# Patient Record
Sex: Female | Born: 1945 | Race: Black or African American | Hispanic: No | Smoking: Never smoker
Health system: Southern US, Community
[De-identification: ages and names within clinical notes are randomized; demographics above are authoritative.]

## PROBLEM LIST (undated history)

## (undated) DIAGNOSIS — M1990 Unspecified osteoarthritis, unspecified site: Secondary | ICD-10-CM

## (undated) DIAGNOSIS — E559 Vitamin D deficiency, unspecified: Secondary | ICD-10-CM

## (undated) DIAGNOSIS — A64 Unspecified sexually transmitted disease: Secondary | ICD-10-CM

## (undated) DIAGNOSIS — T7840XA Allergy, unspecified, initial encounter: Secondary | ICD-10-CM

## (undated) HISTORY — DX: Vitamin D deficiency, unspecified: E55.9

## (undated) HISTORY — DX: Allergy, unspecified, initial encounter: T78.40XA

## (undated) HISTORY — PX: TONSILLECTOMY: SUR1361

## (undated) HISTORY — DX: Unspecified sexually transmitted disease: A64

---

## 1988-11-28 HISTORY — PX: CHOLECYSTECTOMY: SHX55

## 1989-03-30 HISTORY — PX: ABDOMINAL HYSTERECTOMY: SHX81

## 1997-07-17 ENCOUNTER — Emergency Department (HOSPITAL_COMMUNITY): Admission: EM | Admit: 1997-07-17 | Discharge: 1997-07-17 | Payer: Self-pay | Admitting: Emergency Medicine

## 1997-08-20 ENCOUNTER — Other Ambulatory Visit: Admission: RE | Admit: 1997-08-20 | Discharge: 1997-08-20 | Payer: Self-pay | Admitting: Obstetrics and Gynecology

## 2000-09-21 ENCOUNTER — Observation Stay (HOSPITAL_COMMUNITY): Admission: RE | Admit: 2000-09-21 | Discharge: 2000-09-22 | Payer: Self-pay

## 2000-09-21 ENCOUNTER — Encounter (INDEPENDENT_AMBULATORY_CARE_PROVIDER_SITE_OTHER): Payer: Self-pay | Admitting: Specialist

## 2001-03-10 ENCOUNTER — Encounter: Admission: RE | Admit: 2001-03-10 | Discharge: 2001-03-10 | Payer: Self-pay | Admitting: Family Medicine

## 2001-03-10 ENCOUNTER — Encounter: Payer: Self-pay | Admitting: Family Medicine

## 2001-11-29 ENCOUNTER — Encounter: Payer: Self-pay | Admitting: Internal Medicine

## 2001-11-29 ENCOUNTER — Encounter: Admission: RE | Admit: 2001-11-29 | Discharge: 2001-11-29 | Payer: Self-pay | Admitting: Internal Medicine

## 2002-04-29 ENCOUNTER — Ambulatory Visit (HOSPITAL_COMMUNITY): Admission: RE | Admit: 2002-04-29 | Discharge: 2002-04-30 | Payer: Self-pay | Admitting: Internal Medicine

## 2003-01-22 ENCOUNTER — Encounter: Payer: Self-pay | Admitting: *Deleted

## 2003-01-22 ENCOUNTER — Ambulatory Visit (HOSPITAL_COMMUNITY): Admission: RE | Admit: 2003-01-22 | Discharge: 2003-01-22 | Payer: Self-pay | Admitting: *Deleted

## 2004-02-19 ENCOUNTER — Encounter: Admission: RE | Admit: 2004-02-19 | Discharge: 2004-02-19 | Payer: Self-pay | Admitting: Internal Medicine

## 2008-11-14 ENCOUNTER — Encounter: Admission: RE | Admit: 2008-11-14 | Discharge: 2008-11-14 | Payer: Self-pay | Admitting: Obstetrics and Gynecology

## 2009-04-24 ENCOUNTER — Ambulatory Visit (HOSPITAL_COMMUNITY): Admission: RE | Admit: 2009-04-24 | Discharge: 2009-04-24 | Payer: Self-pay | Admitting: Obstetrics and Gynecology

## 2009-04-24 ENCOUNTER — Encounter: Payer: Self-pay | Admitting: Obstetrics and Gynecology

## 2009-04-24 ENCOUNTER — Ambulatory Visit: Payer: Self-pay | Admitting: Surgery

## 2009-11-18 ENCOUNTER — Encounter: Admission: RE | Admit: 2009-11-18 | Discharge: 2009-11-18 | Payer: Self-pay | Admitting: Obstetrics and Gynecology

## 2010-08-15 NOTE — Op Note (Signed)
[facility]  Patient:    Brandy Brown, Brandy Brown                       MRN: [id number] Proc. Date: 09/21/00 Adm. Date:  [id number] Attending:  Meredith Leeds                           Operative Report  PREOPERATIVE DIAGNOSIS:   Symptomatic gallstones.  POSTOPERATIVE DIAGNOSIS:  Symptomatic gallstones.  OPERATION:  Laparoscopic cholecystectomy.  SURGEON:  Zigmund Daniel, M.D.  ASSISTANT:  Anselm Pancoast. Zachery Dakins, M.D.  ANESTHESIA:  General.  DESCRIPTION OF PROCEDURE:  After the patient had adequate monitoring and general anesthesia and routine preparation and draping of the abdomen, I made a short transverse infraumbilical incision at the site of a previous laparoscopy.  I enlarged it slightly to each side.  I dissected down to the fascia and opened it longitudinally, then carefully opened the peritoneum bluntly.  Entry into the peritoneum was easy, and I saw no evidence of injury to the small bowel.  I placed an O Vicryl pursestring suture in the fascia, secured a Hasson cannula, inflated the abdomen with CO2 and examined the contents.  I saw no abnormalities except for slightly inflamed and edematous gallbladder.  I anesthetized three additional port sites and put in three additional ports and then retracted the gallbladder upward and to the right with the patient positioned head up, foot down and tilted to the left.  The hepatoduodenal ligament and infundibulum of the gallbladder were easily discernible.  I dissected out the cystic artery and the cystic duct clearly identifying each, and I clearly identified the emergence of the cystic duct from the infundibulum of the gallbladder.  I clipped the cystic artery with three clips, cut between the two closer to the gallbladder and then I placed four clips on the cystic duct and cut between the two closest to the gallbladder.  I then dissected the gallbladder from the liver using the spatula cautery.   It came off nicely and hemostasis was easily obtained.  I then removed the gallbladder from the body through the umbilical incision.  It was full of gallstones and I had to open it and remove some of the stones in order to get it out.  This resulted in spillage of a couple of gallstones into the abdominal cavity, and I retrieved those.  I copiously irrigated that area and the right upper quadrant and then removed the irrigant.  Bleeding was not a problem.  The clips on the duct and artery were secure.  I tied the pursestring suture and then removed the lateral ports under direct vision and removed the CO2 and then removed the epigastric port.  I closed all skin incisions with intracuticular 4-0 Vicryl and Steri-Strips.  She tolerated the operation well. DD:  09/21/00 TD:  09/21/00 Job: [id number]

## 2010-08-15 NOTE — Discharge Summary (Signed)
NAME:  Brandy Brown, Brandy Brown                          ACCOUNT NO.:  [id number]   MEDICAL RECORD NO.:  [id number]                   PATIENT TYPE:  OIB   LOCATION:  0445                                 FACILITY:  [facility]   PHYSICIAN:  Brandy Brown, M.D.                DATE OF BIRTH:  1945/04/12   DATE OF ADMISSION:  04/29/2002  DATE OF DISCHARGE:  04/30/2002                                 DISCHARGE SUMMARY   PRIMARY CARE PHYSICIAN:  Sharlet Salina, M.D.   DISCHARGE DIAGNOSES:  1. Nausea and vomiting with dehydration, resolved.  2. Elevated liver function tests improving.  3. Hyperglycemia secondary to intravenous fluids.   DISCHARGE MEDICATIONS:  Estrace 1 mg daily.   PROCEDURES AND STUDIES:  None.   ADMISSION LABORATORY DATA:  WBC 7.4, RBC 3.85, hemoglobin 11.1, hematocrit  31.5.  Sodium 129, potassium 3.3, chloride 96, CO2 of 28, glucose 188, BUN  13, creatinine 0.8, total bilirubin 1.5, direct bilirubin 0.3, indirect  bilirubin 1.2, alkaline phosphatase 132, AST 408, ALT 199, total protein 7,  albumin 3.1.   DISPOSITION:  The patient will be discharged home.   CONDITION ON DISCHARGE:  Stable.   HISTORY OF PRESENT ILLNESS:  This is a 65 year old female who presented to  Dr. Jacqualine Code office on January 31st with complaints of nausea and vomiting,  weakness, and dizziness for the past few days.  At the walk in clinic, she  was hydrated with 3 liters of D5W.  A CMET was done which revealed a blood  sugar of 560 and an AST of 257 and an ALT of 125.  The patient was sent to  [facility] for admission; she was a direct admit.   HOSPITAL COURSE:  1. Nausea and vomiting:  The patient had reported having a viral syndrome     over the past weeks with some nausea and vomiting.  During her short     hospitalization here, the patient had no complaints of nausea and     vomiting.  She was tolerating her diet without any problems.  She was     hydrated with IV fluids overnight.  Orthostatic  vital signs were obtained     which were negative.  2. Elevated liver function tests:  Questionable etiology, may be secondary     to her dehydrated status.  Prior to discharge her LFTs were coming down.     Her discharge AST is 263 and ALT is 185 and alkaline phosphatase is     normal at 114.  The patient had no complaints of abdominal pain or     tenderness with palpation, no organomegaly was seen.  Recommendations are     to follow up with her primary care physician this week with repeat labs.  3. Hyperglycemia secondary to intravenous fluids:  Again, the patient did     receive 3 liters of D5W on an  outpatient basis.  She was covered with     sliding scale insulin.  Her CBGs were normal, reading at 70, 87, and 70.     Hemoglobin A1C is 5.6.   DISCHARGE LABORATORY DATA:  WBC count 5.9, RBC 3.62, hemoglobin 10,  hematocrit 29.7.  Sodium 141, potassium 3.9, chloride 109, CO2 of 28,  glucose 82, BUN 9, creatinine 0.8, total bilirubin 1.3, direct bilirubin  0.2, indirect bilirubin is 1.1, alkaline phosphatase 114, AST 263, ALT 185,  albumin 2.7, calcium 7.9.   DISCHARGE INSTRUCTIONS:  The patient should be seen by her primary care  physician this week.     Stephanie Swaziland, NP                      Brandy Brown, M.D.    SJ/MEDQ  D:  04/30/2002  T:  04/30/2002  Job:  [id number]   cc:   Sharlet Salina, M.D.  [address]  [place] [address]  Fax: [phone]

## 2011-01-05 ENCOUNTER — Other Ambulatory Visit: Payer: Self-pay | Admitting: Obstetrics and Gynecology

## 2011-01-05 DIAGNOSIS — Z1231 Encounter for screening mammogram for malignant neoplasm of breast: Secondary | ICD-10-CM

## 2011-01-06 ENCOUNTER — Ambulatory Visit
Admission: RE | Admit: 2011-01-06 | Discharge: 2011-01-06 | Disposition: A | Discharge status: Home / Self Care | Payer: Federal, State, Local not specified - PPO | Source: Ambulatory Visit | Attending: Obstetrics and Gynecology | Admitting: Obstetrics and Gynecology

## 2011-01-06 DIAGNOSIS — Z1231 Encounter for screening mammogram for malignant neoplasm of breast: Secondary | ICD-10-CM

## 2012-01-03 ENCOUNTER — Emergency Department (HOSPITAL_BASED_OUTPATIENT_CLINIC_OR_DEPARTMENT_OTHER)
Admission: EM | Admit: 2012-01-03 | Discharge: 2012-01-03 | Disposition: A | Discharge status: Home / Self Care | Payer: Federal, State, Local not specified - PPO | Attending: Emergency Medicine | Admitting: Emergency Medicine

## 2012-01-03 ENCOUNTER — Encounter (HOSPITAL_BASED_OUTPATIENT_CLINIC_OR_DEPARTMENT_OTHER): Payer: Self-pay | Admitting: Emergency Medicine

## 2012-01-03 DIAGNOSIS — Z882 Allergy status to sulfonamides status: Secondary | ICD-10-CM | POA: Insufficient documentation

## 2012-01-03 DIAGNOSIS — H109 Unspecified conjunctivitis: Secondary | ICD-10-CM | POA: Insufficient documentation

## 2012-01-03 MED ORDER — FLUORESCEIN SODIUM 1 MG OP STRP
1.0000 | ORAL_STRIP | Freq: Once | OPHTHALMIC | Status: AC
  Administered 2012-01-03: 1 via OPHTHALMIC
  Filled 2012-01-03: qty 1

## 2012-01-03 MED ORDER — ERYTHROMYCIN 5 MG/GM OP OINT
TOPICAL_OINTMENT | OPHTHALMIC | Status: AC
  Filled 2012-01-03: qty 3.5

## 2012-01-03 MED ORDER — ERYTHROMYCIN 5 MG/GM OP OINT
TOPICAL_OINTMENT | Freq: Four times a day (QID) | OPHTHALMIC | Status: DC
  Administered 2012-01-03: 04:00:00 via OPHTHALMIC

## 2012-01-03 MED ORDER — TETRACAINE HCL 0.5 % OP SOLN
2.0000 [drp] | Freq: Once | OPHTHALMIC | Status: AC
  Administered 2012-01-03: 2 [drp] via OPHTHALMIC
  Filled 2012-01-03: qty 2

## 2012-01-03 MED ORDER — HYDROCODONE-ACETAMINOPHEN 5-325 MG PO TABS: 1.0000 | ORAL_TABLET | Freq: Four times a day (QID) | ORAL | Status: DC | PRN

## 2012-01-03 NOTE — ED Provider Notes (Signed)
History     CSN: [id number]  Arrival date & time 01/03/12  1478   First MD Initiated Contact with Patient 01/03/12 0357      Chief Complaint  Patient presents with  . Foreign Body in Eye    (Consider location/radiation/quality/duration/timing/severity/associated sxs/prior treatment) HPI This is a 66 year old female who's had a foreign object sensation in her right eye yesterday evening about 7:30. She has not aware of any specific trauma. She has been irrigating her eye without relief. Pain and irritation have worsened since. The symptoms are now moderate to severe. There is associated redness and mucoid exudate. Her vision is only slightly blurry in that eye.  History reviewed. No pertinent past medical history.  Past Surgical History  Procedure Date  . Abdominal hysterectomy     No family history on file.  History  Substance Use Topics  . Smoking status: Never Smoker   . Smokeless tobacco: Not on file  . Alcohol Use: No    OB History    Grav Para Term Preterm Abortions TAB SAB Ect Mult Living                  Review of Systems  All other systems reviewed and are negative.    Allergies  Sulfa drugs cross reactors  Home Medications  No current outpatient prescriptions on file.  BP 171/69  Pulse 84  Temp 97.7 F (36.5 C) (Oral)  Resp 20  Ht 5\' 1"  (1.549 m)  Wt 140 lb (63.504 kg)  BMI 26.45 kg/m2  SpO2 99%  Physical Exam General: Well-developed, well-nourished female in no acute distress; appearance consistent with age of record HENT: normocephalic, atraumatic Eyes: pupils equal round and reactive to light; extraocular muscles intact; right conjunctival inflammation with mucoid exudate, no foreign body seen, no fluorescein uptake on the cornea Neck: supple Lungs: Normal respiratory effort and excursion Abdomen: soft; nondistended Extremities: No deformity; full range of motion Neurologic: Awake, alert and oriented; motor function intact in all  extremities and symmetric; no facial droop Skin: Warm and dry Psychiatric: Normal mood and affect    ED Course  Procedures (including critical care time)    MDM  No corneal abrasions seen. The symptoms are more consistent with an acute conjunctivitis. We'll treat for conjunctivitis and have patient follow up with her optometrist, Dr. Rubye Oaks.         Hanley Seamen, MD 01/03/12 [phone]

## 2012-01-03 NOTE — ED Notes (Signed)
Pt states she feels like she has a foreign object in right eye. Pt has attempted to flush eye without relief.

## 2012-01-18 ENCOUNTER — Other Ambulatory Visit: Payer: Self-pay | Admitting: Obstetrics and Gynecology

## 2012-01-18 DIAGNOSIS — Z1231 Encounter for screening mammogram for malignant neoplasm of breast: Secondary | ICD-10-CM

## 2012-02-18 ENCOUNTER — Ambulatory Visit
Admission: RE | Admit: 2012-02-18 | Discharge: 2012-02-18 | Disposition: A | Discharge status: Home / Self Care | Payer: Federal, State, Local not specified - PPO | Source: Ambulatory Visit | Attending: Obstetrics and Gynecology | Admitting: Obstetrics and Gynecology

## 2012-02-18 DIAGNOSIS — Z1231 Encounter for screening mammogram for malignant neoplasm of breast: Secondary | ICD-10-CM

## 2012-09-08 ENCOUNTER — Ambulatory Visit (INDEPENDENT_AMBULATORY_CARE_PROVIDER_SITE_OTHER): Payer: Medicare Other | Admitting: Emergency Medicine

## 2012-09-08 VITALS — BP 160/83 | HR 82 | Temp 98.3°F | Resp 16 | Ht 62.0 in | Wt 132.2 lb

## 2012-09-08 DIAGNOSIS — M79601 Pain in right arm: Secondary | ICD-10-CM

## 2012-09-08 DIAGNOSIS — S51809A Unspecified open wound of unspecified forearm, initial encounter: Secondary | ICD-10-CM

## 2012-09-08 DIAGNOSIS — M79609 Pain in unspecified limb: Secondary | ICD-10-CM

## 2012-09-08 DIAGNOSIS — S51811A Laceration without foreign body of right forearm, initial encounter: Secondary | ICD-10-CM

## 2012-09-08 NOTE — Progress Notes (Signed)
Procedure:  Verbal consent obtained - local anesthesia with 2% lido.  Closed with 5-0 Ethilon #1 horizontal sutures.  Wound care d/w patient.

## 2012-09-08 NOTE — Progress Notes (Signed)
[facility] [address] [place] [address] [phone]  Date:  09/08/2012   Name:  Brandy Brown   DOB:  19-Jan-1946   MRN:  [id number]  PCP:  No primary provider on file.    Chief Complaint: No chief complaint on file.   History of Present Illness:  Brandy Brown is a 67 y.o. very pleasant female patient who presents with the following:  Working in the kitchen and stabbed herself in the right forearm with a kitchen knife.  She is current on TD.  No improvement with over the counter medications or other home remedies. Denies other complaint or health concern today.   There are no active problems to display for this patient.   Past Medical History  Diagnosis Date  . Allergy     Past Surgical History  Procedure Laterality Date  . Abdominal hysterectomy    . Cholecystectomy      History  Substance Use Topics  . Smoking status: Never Smoker   . Smokeless tobacco: Not on file  . Alcohol Use: No    History reviewed. No pertinent family history.  Allergies  Allergen Reactions  . Sulfa Drugs Cross Reactors     Medication list has been reviewed and updated.  Current Outpatient Prescriptions on File Prior to Visit  Medication Sig Dispense Refill  . HYDROcodone-acetaminophen (NORCO/VICODIN) 5-325 MG per tablet Take 1-2 tablets by mouth every 6 (six) hours as needed for pain.  20 tablet  0   No current facility-administered medications on file prior to visit.    Review of Systems:  As per HPI, otherwise negative.    Physical Examination: Filed Vitals:   09/08/12 1437  BP: 160/83  Pulse: 82  Temp: 98.3 F (36.8 C)  Resp: 16   Filed Vitals:   09/08/12 1437  Height: 5\' 2"  (1.575 m)  Weight: 132 lb 3.2 oz (59.966 kg)   Body mass index is 24.17 kg/(m^2). Ideal Body Weight: Weight in (lb) to have BMI = 25: 136.4   GEN: WDWN, NAD, Non-toxic, Alert & Oriented x 3 HEENT: Atraumatic, Normocephalic.  Ears and Nose: No external  deformity. EXTR: No clubbing/cyanosis/edema NEURO: Normal gait.  PSYCH: Normally interactive. Conversant. Not depressed or anxious appearing.  Calm demeanor.  Right forearm:  1 cm laceration linear in nature on radial aspect of flexor forearm.   No FB, NATI  Assessment and Plan: Laceration forearm   Signed,  Phillips Odor, MD

## 2012-09-08 NOTE — Patient Instructions (Addendum)

## 2012-09-14 ENCOUNTER — Ambulatory Visit (INDEPENDENT_AMBULATORY_CARE_PROVIDER_SITE_OTHER): Payer: Medicare Other | Admitting: Family Medicine

## 2012-09-14 VITALS — BP 134/82 | HR 72 | Temp 98.1°F | Resp 17 | Wt 134.0 lb

## 2012-09-14 DIAGNOSIS — Z5189 Encounter for other specified aftercare: Secondary | ICD-10-CM

## 2012-09-14 DIAGNOSIS — S51801D Unspecified open wound of right forearm, subsequent encounter: Secondary | ICD-10-CM

## 2012-09-14 NOTE — Progress Notes (Signed)
Subjective: No problems from the wound which was repaired one week ago  Objective: Wound appears well-healed. Single suture was removed. Patient tolerated well.  Assessment: Wound right forearm, suture removal  Plan: Return when necessary.

## 2012-09-14 NOTE — Patient Instructions (Signed)
Protect with a Band-Aid for a few more days. Return if problems.

## 2013-04-06 ENCOUNTER — Other Ambulatory Visit: Payer: Self-pay

## 2013-04-06 DIAGNOSIS — Z1231 Encounter for screening mammogram for malignant neoplasm of breast: Secondary | ICD-10-CM

## 2013-04-17 ENCOUNTER — Ambulatory Visit (INDEPENDENT_AMBULATORY_CARE_PROVIDER_SITE_OTHER): Payer: Federal, State, Local not specified - PPO | Admitting: Nurse Practitioner

## 2013-04-17 ENCOUNTER — Encounter: Payer: Self-pay | Admitting: Nurse Practitioner

## 2013-04-17 VITALS — BP 126/64 | HR 72 | Ht 61.75 in | Wt 134.0 lb

## 2013-04-17 DIAGNOSIS — E559 Vitamin D deficiency, unspecified: Secondary | ICD-10-CM

## 2013-04-17 DIAGNOSIS — Z01419 Encounter for gynecological examination (general) (routine) without abnormal findings: Secondary | ICD-10-CM

## 2013-04-17 DIAGNOSIS — Z780 Asymptomatic menopausal state: Secondary | ICD-10-CM

## 2013-04-17 DIAGNOSIS — Z0000 Encounter for general adult medical examination without abnormal findings: Secondary | ICD-10-CM

## 2013-04-17 LAB — POCT URINALYSIS DIPSTICK
Bilirubin, UA: NEGATIVE
Blood, UA: NEGATIVE
Glucose, UA: NEGATIVE
Ketones, UA: NEGATIVE
Leukocytes, UA: NEGATIVE
Nitrite, UA: NEGATIVE
PROTEIN UA: NEGATIVE
UROBILINOGEN UA: NEGATIVE
pH, UA: 5

## 2013-04-17 LAB — HEMOGLOBIN, FINGERSTICK: HEMOGLOBIN, FINGERSTICK: 12.2 g/dL (ref 12.0–16.0)

## 2013-04-17 MED ORDER — VITAMIN D (ERGOCALCIFEROL) 1.25 MG (50000 UNIT) PO CAPS: 50000.0000 [IU] | ORAL_CAPSULE | ORAL | Status: DC

## 2013-04-17 MED ORDER — VALACYCLOVIR HCL 1 G PO TABS: 1000.0000 mg | ORAL_TABLET | Freq: Every day | ORAL | Status: DC

## 2013-04-17 NOTE — Progress Notes (Signed)
Patient ID: Brandy Brown, female   DOB: 05-28-1945, 68 y.o.   MRN: [id number] 68 y.o. G2P2002 Divorced African American Fe here for annual exam. Not SA since 2010.   No LMP recorded. Patient is postmenopausal.          Sexually active: no  The current method of family planning is status post hysterectomy.    Exercising: no  The patient does not participate in regular exercise at present. Smoker:  no  Health Maintenance: Pap:  hysterectomy MMG:  02/22/12, Bi-Rads 1: negative scheduled for 02/14/14 Colonoscopy:  2006, recheck 10 years BMD:   never TDaP:  03/06/10 Labs: HB: 12.2 Urine:  Negative, pH 5.0   reports that she has never smoked. She has never used smokeless tobacco. She reports that she does not drink alcohol or use illicit drugs.  Past Medical History  Diagnosis Date  . Allergy   . GERD (gastroesophageal reflux disease)   . Vitamin D deficiency   . STD (sexually transmitted disease)     HSV    Past Surgical History  Procedure Laterality Date  . Cholecystectomy  1990's  . Abdominal hysterectomy  1991    Fibroids, AUB    Current Outpatient Prescriptions  Medication Sig Dispense Refill  . HYDROcodone-acetaminophen (NORCO/VICODIN) 5-325 MG per tablet Take 1-2 tablets by mouth every 6 (six) hours as needed for pain.  20 tablet  0  . Vitamin D, Ergocalciferol, (DRISDOL) 50000 UNITS CAPS capsule Take 1 capsule by mouth once a week.       No current facility-administered medications for this visit.    Family History  Problem Relation Age of Onset  . Diabetes Mother   . Heart attack Father   . Diabetes Brother     ROS:  Pertinent items are noted in HPI.  Otherwise, a comprehensive ROS was negative.  Exam:   BP 126/64  Pulse 72  Ht 5' 1.75" (1.568 m)  Wt 134 lb (60.782 kg)  BMI 24.72 kg/m2 Height: 5' 1.75" (156.8 cm)  Ht Readings from Last 3 Encounters:  04/17/13 5' 1.75" (1.568 m)  09/08/12 5\' 2"  (1.575 m)  01/03/12 5\' 1"  (1.549 m)    General appearance:  alert, cooperative and appears stated age Head: Normocephalic, without obvious abnormality, atraumatic Neck: no adenopathy, supple, symmetrical, trachea midline and thyroid normal to inspection and palpation Lungs: clear to auscultation bilaterally Breasts: normal appearance, no masses or tenderness Heart: regular rate and rhythm Abdomen: soft, non-tender; no masses,  no organomegaly Extremities: extremities normal, atraumatic, no cyanosis or edema Skin: Skin color, texture, turgor normal. No rashes or lesions Lymph nodes: Cervical, supraclavicular, and axillary nodes normal. No abnormal inguinal nodes palpated Neurologic: Grossly normal   Pelvic: External genitalia:  no lesions              Urethra:  normal appearing urethra with no masses, tenderness or lesions              Bartholin's and Skene's: normal                 Vagina: normal appearing vagina with normal color and discharge, no lesions              Cervix: absent              Pap taken: no Bimanual Exam:  Uterus:  uterus absent              Adnexa: no mass, fullness, tenderness  Rectovaginal: Confirms               Anus:  normal sphincter tone, no lesions  A:  Well Woman with normal exam  S/P TAH secondary to fibroids and AUB 1991  History of HSV I/II + IgG  P:   Pap smear as per guidelines   Mammogram due now and is scheduled  Will get BMD baseline  Refill of Valtrex for a year  Counseled on breast self exam, mammography screening, adequate intake of calcium and vitamin D, diet and exercise, Kegel's exercises return annually or prn  An After Visit Summary was printed and given to the patient.

## 2013-04-17 NOTE — Patient Instructions (Addendum)

## 2013-04-18 LAB — VITAMIN D 25 HYDROXY (VIT D DEFICIENCY, FRACTURES): Vit D, 25-Hydroxy: 44 ng/mL (ref 30–89)

## 2013-04-18 LAB — COMPREHENSIVE METABOLIC PANEL
ALT: 23 U/L (ref 0–35)
AST: 20 U/L (ref 0–37)
Albumin: 4.1 g/dL (ref 3.5–5.2)
Alkaline Phosphatase: 74 U/L (ref 39–117)
BILIRUBIN TOTAL: 0.8 mg/dL (ref 0.3–1.2)
BUN: 12 mg/dL (ref 6–23)
CALCIUM: 9.5 mg/dL (ref 8.4–10.5)
CHLORIDE: 103 meq/L (ref 96–112)
CO2: 26 mEq/L (ref 19–32)
CREATININE: 0.71 mg/dL (ref 0.50–1.10)
Glucose, Bld: 79 mg/dL (ref 70–99)
Potassium: 3.9 mEq/L (ref 3.5–5.3)
Sodium: 139 mEq/L (ref 135–145)
Total Protein: 7.2 g/dL (ref 6.0–8.3)

## 2013-04-18 LAB — HEMOGLOBIN A1C
Hgb A1c MFr Bld: 5.5 % (ref <5.7)
Mean Plasma Glucose: 111 mg/dL (ref <117)

## 2013-04-18 LAB — TSH: TSH: 1.384 u[IU]/mL (ref 0.350–4.500)

## 2013-04-18 LAB — LIPID PANEL
Cholesterol: 196 mg/dL (ref 0–200)
HDL: 98 mg/dL (ref >39)
LDL CALC: 89 mg/dL (ref 0–99)
Total CHOL/HDL Ratio: 2 Ratio
Triglycerides: 46 mg/dL (ref <150)
VLDL: 9 mg/dL (ref 0–40)

## 2013-04-19 ENCOUNTER — Other Ambulatory Visit: Payer: Self-pay | Admitting: Nurse Practitioner

## 2013-04-19 NOTE — Telephone Encounter (Signed)
eScribe request from [facility] for refill on VALTREX Last filled - 04/17/13,  #90 X 3 Last AEX - 04/17/13 RX denied.  Filled on 04/17/13.

## 2013-04-20 ENCOUNTER — Telehealth: Payer: Self-pay | Admitting: *Deleted

## 2013-04-20 NOTE — Telephone Encounter (Signed)
Message copied by Luisa DagoPHILLIPS, STEPHANIE C on Thu Apr 20, 2013  1:02 PM ------      Message from: Ria CommentGRUBB, PATRICIA R      Created: Tue Apr 18, 2013  8:31 AM       Let patient know results - Good News ------

## 2013-04-20 NOTE — Telephone Encounter (Signed)
I have attempted to contact this patient by phone with the following results: left message to return my call on answering machine (home).  

## 2013-04-20 NOTE — Progress Notes (Signed)
Encounter reviewed by Dr. Conley SimmondsBrook Silva. Mammogram scheduled for 04/26/13.

## 2013-04-20 NOTE — Telephone Encounter (Signed)
Pt notified in result note.  

## 2013-04-26 ENCOUNTER — Ambulatory Visit
Admission: RE | Admit: 2013-04-26 | Discharge: 2013-04-26 | Disposition: A | Discharge status: Home / Self Care | Payer: Federal, State, Local not specified - PPO | Source: Ambulatory Visit

## 2013-04-26 DIAGNOSIS — Z1231 Encounter for screening mammogram for malignant neoplasm of breast: Secondary | ICD-10-CM

## 2013-04-29 ENCOUNTER — Other Ambulatory Visit: Payer: Self-pay | Admitting: Nurse Practitioner

## 2014-01-29 ENCOUNTER — Encounter: Payer: Self-pay | Admitting: Nurse Practitioner

## 2014-03-26 ENCOUNTER — Other Ambulatory Visit: Payer: Self-pay

## 2014-03-26 DIAGNOSIS — Z1231 Encounter for screening mammogram for malignant neoplasm of breast: Secondary | ICD-10-CM

## 2014-04-19 ENCOUNTER — Encounter: Payer: Self-pay | Admitting: Nurse Practitioner

## 2014-04-19 ENCOUNTER — Ambulatory Visit (INDEPENDENT_AMBULATORY_CARE_PROVIDER_SITE_OTHER): Payer: Federal, State, Local not specified - PPO | Admitting: Nurse Practitioner

## 2014-04-19 VITALS — BP 126/80 | HR 84 | Ht 61.75 in | Wt 134.6 lb

## 2014-04-19 DIAGNOSIS — H6120 Impacted cerumen, unspecified ear: Secondary | ICD-10-CM

## 2014-04-19 DIAGNOSIS — Z0000 Encounter for general adult medical examination without abnormal findings: Secondary | ICD-10-CM

## 2014-04-19 DIAGNOSIS — E2839 Other primary ovarian failure: Secondary | ICD-10-CM

## 2014-04-19 DIAGNOSIS — H6123 Impacted cerumen, bilateral: Secondary | ICD-10-CM

## 2014-04-19 DIAGNOSIS — Z01419 Encounter for gynecological examination (general) (routine) without abnormal findings: Secondary | ICD-10-CM

## 2014-04-19 DIAGNOSIS — E559 Vitamin D deficiency, unspecified: Secondary | ICD-10-CM

## 2014-04-19 LAB — LIPID PANEL
Cholesterol: 208 mg/dL — ABNORMAL HIGH (ref 0–200)
HDL: 108 mg/dL (ref >39)
LDL CALC: 90 mg/dL (ref 0–99)
TRIGLYCERIDES: 51 mg/dL (ref <150)
Total CHOL/HDL Ratio: 1.9 Ratio
VLDL: 10 mg/dL (ref 0–40)

## 2014-04-19 LAB — POCT URINALYSIS DIPSTICK
Leukocytes, UA: NEGATIVE
UROBILINOGEN UA: NEGATIVE
pH, UA: 5

## 2014-04-19 LAB — COMPREHENSIVE METABOLIC PANEL
ALBUMIN: 3.8 g/dL (ref 3.5–5.2)
ALK PHOS: 90 U/L (ref 39–117)
ALT: 23 U/L (ref 0–35)
AST: 27 U/L (ref 0–37)
BUN: 14 mg/dL (ref 6–23)
CHLORIDE: 102 meq/L (ref 96–112)
CO2: 27 meq/L (ref 19–32)
CREATININE: 0.85 mg/dL (ref 0.50–1.10)
Calcium: 9.7 mg/dL (ref 8.4–10.5)
GLUCOSE: 75 mg/dL (ref 70–99)
Potassium: 4.7 mEq/L (ref 3.5–5.3)
SODIUM: 139 meq/L (ref 135–145)
TOTAL PROTEIN: 7.2 g/dL (ref 6.0–8.3)
Total Bilirubin: 0.8 mg/dL (ref 0.2–1.2)

## 2014-04-19 LAB — HEMOGLOBIN, FINGERSTICK: HEMOGLOBIN, FINGERSTICK: 13.3 g/dL (ref 12.0–16.0)

## 2014-04-19 LAB — TSH: TSH: 2.249 u[IU]/mL (ref 0.350–4.500)

## 2014-04-19 MED ORDER — VITAMIN D (ERGOCALCIFEROL) 1.25 MG (50000 UNIT) PO CAPS: 50000.0000 [IU] | ORAL_CAPSULE | ORAL | Status: DC

## 2014-04-19 MED ORDER — VALACYCLOVIR HCL 1 G PO TABS: 1000.0000 mg | ORAL_TABLET | Freq: Every day | ORAL | Status: DC

## 2014-04-19 NOTE — Progress Notes (Signed)
69 y.o. [id number] Divorced  African American Fe here for annual exam.  Complains of fullness of right ear.  No sinus congestion, pain, hearing changes.  Patient's last menstrual period was 03/30/1989.          Sexually active: No.  The current method of family planning is status post hysterectomy.    Exercising: No.  The patient does not participate in regular exercise at present. Smoker:  no  Health Maintenance: Pap: TAH, none on record MMG:  04/28/13 Bi-Rads 1: Negative scheduled next Friday Colonoscopy:  2006 @ [facility] - normal f/u in 10 years  BMD:  02/22/2004   TDaP:  03/26/10  Labs: Hgb: 12.4 ; Urine: Small ketones   reports that she has never smoked. She has never used smokeless tobacco. She reports that she does not drink alcohol or use illicit drugs.  Past Medical History  Diagnosis Date  . Allergy   . GERD (gastroesophageal reflux disease)   . Vitamin D deficiency   . STD (sexually transmitted disease)     HSV    Past Surgical History  Procedure Laterality Date  . Cholecystectomy  1990's  . Abdominal hysterectomy  1991    Fibroids, AUB  . Tonsillectomy  age 69    Current Outpatient Prescriptions  Medication Sig Dispense Refill  . valACYclovir (VALTREX) 1000 MG tablet Take 1 tablet (1,000 mg total) by mouth daily. 90 tablet 3  . Vitamin D, Ergocalciferol, (DRISDOL) 50000 UNITS CAPS capsule Take 1 capsule (50,000 Units total) by mouth once a week. 30 capsule 3   No current facility-administered medications for this visit.    Family History  Problem Relation Age of Onset  . Diabetes Mother   . Heart attack Father   . Diabetes Brother     ROS:  Pertinent items are noted in HPI.  Otherwise, a comprehensive ROS was negative.  Exam:   BP 126/80 mmHg  Pulse 84  Ht 5' 1.75" (1.568 m)  Wt 134 lb 9.6 oz (61.054 kg)  BMI 24.83 kg/m2  LMP 03/30/1989 Height: 5' 1.75" (156.8 cm) Ht Readings from Last 3 Encounters:  04/19/14 5' 1.75" (1.568 m)  04/17/13 5' 1.75"  (1.568 m)  09/08/12 5\' 2"  (1.575 m)    General appearance: alert, cooperative and appears stated age Head: Normocephalic, without obvious abnormality, atraumatic  Both TM's are not visualized secondary to cerumen. Neck: no adenopathy, supple, symmetrical, trachea midline and thyroid normal to inspection and palpation Lungs: clear to auscultation bilaterally Breasts: normal appearance, no masses or tenderness Heart: regular rate and rhythm Abdomen: soft, non-tender; no masses,  no organomegaly Extremities: extremities normal, atraumatic, no cyanosis or edema Skin: Skin color, texture, turgor normal. No rashes or lesions Lymph nodes: Cervical, supraclavicular, and axillary nodes normal. No abnormal inguinal nodes palpated Neurologic: Grossly normal   Pelvic: External genitalia:  no lesions              Urethra:  normal appearing urethra with no masses, tenderness or lesions              Bartholin's and Skene's: normal                 Vagina: normal appearing vagina with normal color and discharge, no lesions              Cervix: absent              Pap taken: No. Bimanual Exam:  Uterus:  uterus absent  Adnexa: no mass, fullness, tenderness               Rectovaginal: Confirms               Anus:  normal sphincter tone, no lesions  Chaperone present: No  A:  Well Woman with normal exam  S/P TAH secondary to fibroids and AUB 1991 History of HSV I/II + IgG  History of Vit D deficiency  Cerumen both ears  P:   Reviewed health and wellness pertinent to exam  Pap smear not taken today  Mammogram is due 10/16  Follow with labs  OTC Cerumenex to use prn  Colonoscopy is due this summer and will schedule  Counseled on breast self exam, mammography screening, adequate intake of calcium and vitamin D, diet and exercise, Kegel's exercises return annually or prn  An After Visit Summary was printed and given to the patient.

## 2014-04-19 NOTE — Patient Instructions (Signed)

## 2014-04-20 LAB — VITAMIN D 25 HYDROXY (VIT D DEFICIENCY, FRACTURES): Vit D, 25-Hydroxy: 24 ng/mL — ABNORMAL LOW (ref 30–100)

## 2014-04-22 NOTE — Progress Notes (Signed)
Reviewed personally.  M. Suzanne Sanae Willetts, MD.  

## 2014-04-27 ENCOUNTER — Ambulatory Visit
Admission: RE | Admit: 2014-04-27 | Discharge: 2014-04-27 | Disposition: A | Discharge status: Home / Self Care | Payer: Federal, State, Local not specified - PPO | Source: Ambulatory Visit

## 2014-04-27 DIAGNOSIS — Z1231 Encounter for screening mammogram for malignant neoplasm of breast: Secondary | ICD-10-CM

## 2014-09-24 ENCOUNTER — Ambulatory Visit (INDEPENDENT_AMBULATORY_CARE_PROVIDER_SITE_OTHER): Payer: Federal, State, Local not specified - PPO | Admitting: Nurse Practitioner

## 2014-09-24 ENCOUNTER — Encounter: Payer: Self-pay | Admitting: Nurse Practitioner

## 2014-09-24 VITALS — BP 132/80 | HR 72 | Temp 98.2°F | Resp 20 | Wt 136.0 lb

## 2014-09-24 DIAGNOSIS — R300 Dysuria: Secondary | ICD-10-CM | POA: Diagnosis not present

## 2014-09-24 DIAGNOSIS — R1030 Lower abdominal pain, unspecified: Secondary | ICD-10-CM

## 2014-09-24 LAB — CBC WITH DIFFERENTIAL/PLATELET
BASOS ABS: 0 10*3/uL (ref 0.0–0.1)
BASOS PCT: 0 % (ref 0–1)
EOS ABS: 0 10*3/uL (ref 0.0–0.7)
EOS PCT: 0 % (ref 0–5)
HCT: 37.7 % (ref 36.0–46.0)
HEMOGLOBIN: 12.7 g/dL (ref 12.0–15.0)
LYMPHS ABS: 2.2 10*3/uL (ref 0.7–4.0)
Lymphocytes Relative: 26 % (ref 12–46)
MCH: 27.9 pg (ref 26.0–34.0)
MCHC: 33.7 g/dL (ref 30.0–36.0)
MCV: 82.9 fL (ref 78.0–100.0)
MONO ABS: 0.7 10*3/uL (ref 0.1–1.0)
MPV: 8.8 fL (ref 8.6–12.4)
Monocytes Relative: 8 % (ref 3–12)
Neutro Abs: 5.5 10*3/uL (ref 1.7–7.7)
Neutrophils Relative %: 66 % (ref 43–77)
PLATELETS: 299 10*3/uL (ref 150–400)
RBC: 4.55 MIL/uL (ref 3.87–5.11)
RDW: 13.4 % (ref 11.5–15.5)
WBC: 8.4 10*3/uL (ref 4.0–10.5)

## 2014-09-24 LAB — POCT URINALYSIS DIPSTICK
Bilirubin, UA: NEGATIVE
GLUCOSE UA: NEGATIVE
Nitrite, UA: NEGATIVE
Protein, UA: NEGATIVE
RBC UA: NEGATIVE
Urobilinogen, UA: NEGATIVE
pH, UA: 5

## 2014-09-24 NOTE — Patient Instructions (Signed)

## 2014-09-24 NOTE — Progress Notes (Signed)
S:  69 y.o.Divorced AA G2P2 female presents with complaint of UTI. Symptoms began 14 days ago. With symptoms of burning with urination, urinary frequency, urinary urgency along with lower pelvic discomfort, undocumented fever and chills with bowel changes.   Pertinent positives include:  the patient is having constitutional symptoms, including chills, fatigue and fevers.. Sexually active: no not since 2005.    She is menopausal but no symptoms of vaginal dryness. Last UTI documented very long time ago. Some recent bowel changes with dark red blood with stool - no current bleeding.  Recent diet change with eating cereal with nuts.  Increase in flatus.  Last colonoscopy 2006 at [facility] and was normal.  ROS: feels well, fatigued, fever and chills  O alert, oriented to person, place, and time, normal mood, behavior, speech, dress, motor activity, and thought processes   healthy,  alert,  not in acute distress, well developed and well nourished  Abdomen- soft and non tender, mild intermittent 'twinges' of pain throughout lower abdomen.  No rebounding, normal BS.  No CVA tenderness  pelvic is deferred   Diagnostic Test:    Urinalysis trace WBC and ketones   PROCEDURES:  Urine culture,urine micro, Stat CBC  Assessment: R/O UTI   Possible diverticulitis   Plan:  Maintain adequate hydration. Follow up if symptoms not improving, and as needed.   Medication Therapy: hold for now awaiting CBC   Lab: follow with urine culture and micro  Discussed care with Dr. Edward Jolly  RV

## 2014-09-25 ENCOUNTER — Telehealth: Payer: Self-pay | Admitting: Emergency Medicine

## 2014-09-25 LAB — URINALYSIS, MICROSCOPIC ONLY
Bacteria, UA: NONE SEEN
Casts: NONE SEEN
Crystals: NONE SEEN

## 2014-09-25 LAB — URINE CULTURE
COLONY COUNT: NO GROWTH
ORGANISM ID, BACTERIA: NO GROWTH

## 2014-09-25 NOTE — Telephone Encounter (Signed)
-----   Message from Patricia Grubb, FNP sent at 09/25/2014  8:25 AM EDT ----- Pt. was notified of stat CBC last pm at 7:30 pm.  She agrees to wait on treatment for UTI once urine culture is back.  She is to alert us if symptoms worsens.  She has agreed to let us refer her back to [facility] - as we feel these symptoms are GI related.  She is due for repeat colonoscopy anyway.  Will place the order and will you call to get that referral arranged.  Should be seen fairly soon with a two week duration of abdominal pain with fever and chills.  When you call with urine micro results just inform her of status on referral. 

## 2014-09-25 NOTE — Progress Notes (Signed)
Encounter reviewed by Dr. Janean SarkBrook Amundson C. Silva. Will need to return to Gastroenterology for consultation.

## 2014-09-25 NOTE — Telephone Encounter (Signed)
Called [facility] and patient is scheduled for first available appointment with [facility] for 10/05/14 at 1115 with PA Celso Amyina Garrett. This is first available appointment. The prior Gastroenterologist that patient has seen has since retired. Patient is also on wait list as well.  Patient agreeable to appointment. States she still doesn't feel well and that "something is out of whack" but denies worsening of symptoms. No fevers, however, has sweating and fatigue.   Advised patient still waiting on urine culture results.  Patient advised to return call to office or seek care in local Emergency Department if develops fevers, nausea, vomiting, abdominal pain or difficulty eating or drinking.  Routing to provider for final review. Patient agreeable to disposition. Will close encounter.   Patient aware provider will review message and nurse will return call if any additional advice or change of disposition.    cc Dr. Kathreen CosierSilva       Becky Frahm

## 2014-09-27 ENCOUNTER — Telehealth: Payer: Self-pay | Admitting: Emergency Medicine

## 2014-09-27 NOTE — Telephone Encounter (Signed)
Detailed message left to advise patient that urine culture was negative. Okay per designated party release form.   Referral has been completed, patient aware of appointment spoke with patient on 09/25/14 and referral information given.  Routing to provider for final review. Patient agreeable to disposition. Will close encounter.

## 2014-09-27 NOTE — Telephone Encounter (Addendum)
Patient left a message at lunch returning call.

## 2014-09-27 NOTE — Telephone Encounter (Signed)
-----   Message from Patricia Grubb, FNP sent at 09/25/2014  8:25 AM EDT ----- Pt. was notified of stat CBC last pm at 7:30 pm.  She agrees to wait on treatment for UTI once urine culture is back.  She is to alert us if symptoms worsens.  She has agreed to let us refer her back to [facility] - as we feel these symptoms are GI related.  She is due for repeat colonoscopy anyway.  Will place the order and will you call to get that referral arranged.  Should be seen fairly soon with a two week duration of abdominal pain with fever and chills.  When you call with urine micro results just inform her of status on referral. 

## 2014-09-27 NOTE — Telephone Encounter (Signed)
-----   Message from Ria CommentPatricia Grubb, FNP sent at 09/25/2014  8:25 AM EDT ----- Pt. was notified of stat CBC last pm at 7:30 pm.  She agrees to wait on treatment for UTI once urine culture is back.  She is to alert us if symptoms worsens.  She has agreed to let us refer her back to [facility] - as we feel these symptoms are GI related.  She is due for repeat colonoscopy anyway.  Will place the order and will you call to get that referral arranged.  Should be seen fairly soon with a two week duration of abdominal pain with fever and chills.  When you call with urine micro results just inform her of status on referral.

## 2014-09-27 NOTE — Telephone Encounter (Addendum)
Spoke with patient.  She is given message that urine culture was negative and no urinary infection.   Advised patient would check with [facility] to see if any earlier appointment as patient prior GI at [facility] has retired. Called [facility], first available new patient appointment is 11/2014 so did not schedule. Called patient to advise will keep [facility] appointment and if any concerns prior to appointment to please call. Patient agreeable.   Patient has [facility] appointment 10/05/14 and is aware of appointment.

## 2014-09-27 NOTE — Telephone Encounter (Signed)
Notes Recorded by Verner Choleborah S Leonard, CNM on 09/26/2014 at 8:51 AM Patient needs to be notified urine culture negative, per PG note should have been referred back to GI asap due status noted at appointment. Has this been done?

## 2014-10-05 ENCOUNTER — Other Ambulatory Visit: Payer: Self-pay | Admitting: Physician Assistant

## 2014-10-05 DIAGNOSIS — R109 Unspecified abdominal pain: Secondary | ICD-10-CM

## 2014-10-08 ENCOUNTER — Other Ambulatory Visit: Payer: Federal, State, Local not specified - PPO

## 2014-10-09 ENCOUNTER — Ambulatory Visit
Admission: RE | Admit: 2014-10-09 | Discharge: 2014-10-09 | Disposition: A | Discharge status: Home / Self Care | Payer: Federal, State, Local not specified - PPO | Source: Ambulatory Visit | Attending: Physician Assistant | Admitting: Physician Assistant

## 2014-10-09 DIAGNOSIS — R109 Unspecified abdominal pain: Secondary | ICD-10-CM

## 2014-10-09 MED ORDER — IOPAMIDOL (ISOVUE-300) INJECTION 61%
100.0000 mL | Freq: Once | INTRAVENOUS | Status: AC | PRN
  Administered 2014-10-09: 100 mL via INTRAVENOUS

## 2014-10-12 ENCOUNTER — Encounter (HOSPITAL_COMMUNITY): Payer: Self-pay | Admitting: *Deleted

## 2014-10-18 ENCOUNTER — Encounter (HOSPITAL_COMMUNITY): Payer: Self-pay | Admitting: *Deleted

## 2014-10-18 ENCOUNTER — Ambulatory Visit (HOSPITAL_COMMUNITY): Payer: Federal, State, Local not specified - PPO | Admitting: Registered Nurse

## 2014-10-18 ENCOUNTER — Ambulatory Visit (HOSPITAL_COMMUNITY)
Admission: RE | Admit: 2014-10-18 | Discharge: 2014-10-18 | Disposition: A | Discharge status: Home / Self Care | Payer: Federal, State, Local not specified - PPO | Source: Ambulatory Visit | Attending: Gastroenterology | Admitting: Gastroenterology

## 2014-10-18 ENCOUNTER — Encounter (HOSPITAL_COMMUNITY)
Admission: RE | Disposition: A | Discharge status: Home / Self Care | Payer: Self-pay | Source: Ambulatory Visit | Attending: Gastroenterology

## 2014-10-18 DIAGNOSIS — Z1211 Encounter for screening for malignant neoplasm of colon: Secondary | ICD-10-CM | POA: Diagnosis not present

## 2014-10-18 DIAGNOSIS — K5730 Diverticulosis of large intestine without perforation or abscess without bleeding: Secondary | ICD-10-CM | POA: Diagnosis not present

## 2014-10-18 HISTORY — DX: Unspecified osteoarthritis, unspecified site: M19.90

## 2014-10-18 HISTORY — PX: COLONOSCOPY WITH PROPOFOL: SHX5780

## 2014-10-18 SURGERY — COLONOSCOPY WITH PROPOFOL
Anesthesia: Monitor Anesthesia Care

## 2014-10-18 MED ORDER — PROPOFOL INFUSION 10 MG/ML OPTIME
INTRAVENOUS | Status: DC | PRN
  Administered 2014-10-18: 120 ug/kg/min via INTRAVENOUS

## 2014-10-18 MED ORDER — SODIUM CHLORIDE 0.9 % IJ SOLN
INTRAMUSCULAR | Status: AC
  Filled 2014-10-18: qty 10

## 2014-10-18 MED ORDER — PROPOFOL 10 MG/ML IV BOLUS
INTRAVENOUS | Status: AC
  Filled 2014-10-18: qty 20

## 2014-10-18 MED ORDER — LIDOCAINE HCL (CARDIAC) 20 MG/ML IV SOLN
INTRAVENOUS | Status: AC
  Filled 2014-10-18: qty 5

## 2014-10-18 MED ORDER — LACTATED RINGERS IV SOLN
INTRAVENOUS | Status: DC
  Administered 2014-10-18: 1000 mL via INTRAVENOUS

## 2014-10-18 MED ORDER — EPHEDRINE SULFATE 50 MG/ML IJ SOLN
INTRAMUSCULAR | Status: AC
  Filled 2014-10-18: qty 1

## 2014-10-18 MED ORDER — LIDOCAINE HCL (CARDIAC) 20 MG/ML IV SOLN
INTRAVENOUS | Status: DC | PRN
  Administered 2014-10-18: 100 mg via INTRAVENOUS

## 2014-10-18 MED ORDER — PROPOFOL 10 MG/ML IV BOLUS
INTRAVENOUS | Status: DC | PRN
  Administered 2014-10-18: 20 mg via INTRAVENOUS

## 2014-10-18 MED ORDER — SODIUM CHLORIDE 0.9 % IV SOLN: INTRAVENOUS | Status: DC

## 2014-10-18 SURGICAL SUPPLY — 22 items

## 2014-10-18 NOTE — Discharge Instructions (Signed)
Colonoscopy  Post procedure instructions:  Read the instructions outlined below and refer to this sheet in the next few weeks. These discharge instructions provide you with general information on caring for yourself after you leave the hospital. Your doctor may also give you specific instructions. While your treatment has been planned according to the most current medical practices available, unavoidable complications occasionally occur. If you have any problems or questions after discharge, call Dr. Dulce Sellar at [facility] [phone]).  HOME CARE INSTRUCTIONS  ACTIVITY:  You may resume your regular activity, but move at a slower pace for the next 24 hours.   Take frequent rest periods for the next 24 hours.   Walking will help get rid of the air and reduce the bloated feeling in your belly (abdomen).   No driving for 24 hours (because of the medicine (anesthesia) used during the test).   You may shower.   Do not sign any important legal documents or operate any machinery for 24 hours (because of the anesthesia used during the test).  NUTRITION:  Drink plenty of fluids.   You may resume your normal diet as instructed by your doctor.   Begin with a light meal and progress to your normal diet. Heavy or fried foods are harder to digest and may make you feel sick to your stomach (nauseated).   Avoid alcoholic beverages for 24 hours or as instructed.  MEDICATIONS:  You may resume your normal medications unless your doctor tells you otherwise.  WHAT TO EXPECT TODAY:  Some feelings of bloating in the abdomen.   Passage of more gas than usual.   Spotting of blood in your stool or on the toilet paper.  IF YOU HAD POLYPS REMOVED DURING THE COLONOSCOPY:  No aspirin products for 7 days or as instructed.   No alcohol for 7 days or as instructed.   Eat a soft diet for the next 24 hours.   FINDING OUT THE RESULTS OF YOUR TEST  Not all test results are available during your  visit. If your test results are not back during the visit, make an appointment with your caregiver to find out the results. Do not assume everything is normal if you have not heard from your caregiver or the medical facility. It is important for you to follow up on all of your test results.     SEEK IMMEDIATE MEDICAL CARE IF:   You have more than a spotting of blood in your stool.   Your belly is swollen (abdominal distention).   You are nauseated or vomiting.   You have a fever.   You have abdominal pain or discomfort that is severe or gets worse throughout the day.    Document Released: 10/29/2003 Document Revised: 11/26/2010 Document Reviewed: 10/27/2007 [facility] Patient Information 2012 [place], [place]. Colonoscopy, Care After These instructions give you information on caring for yourself after your procedure. Your doctor may also give you more specific instructions. Call your doctor if you have any problems or questions after your procedure. HOME CARE  Do not drive for 24 hours.  Do not sign important papers or use machinery for 24 hours.  You may shower.  You may go back to your usual activities, but go slower for the first 24 hours.  Take rest breaks often during the first 24 hours.  Walk around or use warm packs on your belly (abdomen) if you have belly cramping or gas.  Drink enough fluids to keep your pee (urine) clear or pale  yellow.  Resume your normal diet. Avoid heavy or fried foods.  Avoid drinking alcohol for 24 hours or as told by your doctor.  Only take medicines as told by your doctor. If a tissue sample (biopsy) was taken during the procedure:   Do not take aspirin or blood thinners for 7 days, or as told by your doctor.  Do not drink alcohol for 7 days, or as told by your doctor.  Eat soft foods for the first 24 hours. GET HELP IF: You still have a small amount of blood in your poop (stool) 2-3 days after the procedure. GET HELP RIGHT AWAY  IF:  You have more than a small amount of blood in your poop.  You see clumps of tissue (blood clots) in your poop.  Your belly is puffy (swollen).  You feel sick to your stomach (nauseous) or throw up (vomit).  You have a fever.  You have belly pain that gets worse and medicine does not help. MAKE SURE YOU:  Understand these instructions.  Will watch your condition.  Will get help right away if you are not doing well or get worse. Document Released: 04/18/2010 Document Revised: 03/21/2013 Document Reviewed: 11/21/2012 [facility] Patient Information 2015 [place], [place]. This information is not intended to replace advice given to you by your health care provider. Make sure you discuss any questions you have with your health care provider.

## 2014-10-18 NOTE — Transfer of Care (Signed)
Immediate Anesthesia Transfer of Care Note  Patient: Brandy Brown  Procedure(s) Performed: Procedure(s) with comments: COLONOSCOPY WITH PROPOFOL (N/A) - ultraslim scope   Patient Location: PACU, Antenatal and Endoscopy Unit  Anesthesia Type:MAC  Level of Consciousness: awake, alert , oriented and patient cooperative  Airway & Oxygen Therapy: Patient Spontanous Breathing and Patient connected to face mask oxygen  Post-op Assessment: Report given to RN, Post -op Vital signs reviewed and stable and Patient moving all extremities  Post vital signs: Reviewed and stable  Last Vitals:  Filed Vitals:   10/18/14 0656  BP: 157/65  Temp: 36.8 C  Resp: 18    Complications: No apparent anesthesia complications

## 2014-10-18 NOTE — Interval H&P Note (Signed)
History and Physical Interval Note:  10/18/2014 8:11 AM  Brandy Brown  has presented today for surgery, with the diagnosis of screening   The various methods of treatment have been discussed with the patient and family. After consideration of risks, benefits and other options for treatment, the patient has consented to  Procedure(s) with comments: COLONOSCOPY WITH PROPOFOL (N/A) - ultraslim scope  as a surgical intervention .  The patient's history has been reviewed, patient examined, no change in status, stable for surgery.  I have reviewed the patient's chart and labs.  Questions were answered to the patient's satisfaction.     [name]  Assessment:  1.  Lower abdominal pain.  Over 10 years since last colonoscopy in patient > 69 years old.  CT sigmoid diverticulosis but no diverticulitis.  Plan:  1.  Colonoscopy.  Use of ultraslim colonoscope. 2.  Risks (bleeding, infection, bowel perforation that could require surgery, sedation-related changes in cardiopulmonary systems), benefits (identification and possible treatment of source of symptoms, exclusion of certain causes of symptoms), and alternatives (watchful waiting, radiographic imaging studies, empiric medical treatment) of colonoscopy were explained to patient/family in detail and patient wishes to proceed.

## 2014-10-18 NOTE — Anesthesia Preprocedure Evaluation (Addendum)
Anesthesia Evaluation  Patient identified by MRN, date of birth, ID band Patient awake    Reviewed: Allergy & Precautions, NPO status , Patient's Chart, lab work & pertinent test results  Airway Mallampati: II  TM Distance: >3 FB Neck ROM: Full    Dental no notable dental hx.    Pulmonary neg pulmonary ROS,  breath sounds clear to auscultation  Pulmonary exam normal       Cardiovascular negative cardio ROS Normal cardiovascular examRhythm:Regular Rate:Normal     Neuro/Psych negative neurological ROS  negative psych ROS   GI/Hepatic negative GI ROS, Neg liver ROS,   Endo/Other  negative endocrine ROS  Renal/GU negative Renal ROS  negative genitourinary   Musculoskeletal negative musculoskeletal ROS (+)   Abdominal   Peds negative pediatric ROS (+)  Hematology negative hematology ROS (+)   Anesthesia Other Findings   Reproductive/Obstetrics negative OB ROS                             Anesthesia Physical Anesthesia Plan  ASA: I  Anesthesia Plan: MAC   Post-op Pain Management:    Induction:   Airway Management Planned: Simple Face Mask  Additional Equipment:   Intra-op Plan:   Post-operative Plan:   Informed Consent: I have reviewed the patients History and Physical, chart, labs and discussed the procedure including the risks, benefits and alternatives for the proposed anesthesia with the patient or authorized representative who has indicated his/her understanding and acceptance.   Dental advisory given  Plan Discussed with: CRNA  Anesthesia Plan Comments:         Anesthesia Quick Evaluation  

## 2014-10-18 NOTE — Anesthesia Postprocedure Evaluation (Signed)
  Anesthesia Post-op Note  Patient: Brandy Brown  Procedure(s) Performed: Procedure(s) (LRB): COLONOSCOPY WITH PROPOFOL (N/A)  Patient Location: PACU  Anesthesia Type: MAC  Level of Consciousness: awake and alert   Airway and Oxygen Therapy: Patient Spontanous Breathing  Post-op Pain: mild  Post-op Assessment: Post-op Vital signs reviewed, Patient's Cardiovascular Status Stable, Respiratory Function Stable, Patent Airway and No signs of Nausea or vomiting  Last Vitals:  Filed Vitals:   10/18/14 0930  BP: 192/101  Pulse:   Temp:   Resp: 20    Post-op Vital Signs: stable   Complications: No apparent anesthesia complications

## 2014-10-18 NOTE — Op Note (Signed)
[facility] [address] [place], [address]   COLONOSCOPY PROCEDURE REPORT  PATIENT: Brandy Brown, Brandy Brown  MR#: [id number] BIRTHDATE: October 18, 1945 , 69  yrs. old GENDER: female ENDOSCOPIST: Willis Modena, MD REFERRED [id number] Berneice Gandy, FNP PROCEDURE DATE:  11-06-14 PROCEDURE:   Colonoscopy, diagnostic ASA CLASS:   Class I INDICATIONS:abdominal pain, over 10 years since last colonoscopy. MEDICATIONS: Monitored anesthesia care  DESCRIPTION OF PROCEDURE:   After the risks benefits and alternatives of the procedure were thoroughly explained, informed consent was obtained.  revealed no abnormalities of the rectum. The [id number] ([id number])  endoscope was introduced through the anus and advanced to the cecum, which was identified by both the appendix and ileocecal valve. No adverse events experienced.   The quality of the prep was suboptimal  The instrument was then slowly withdrawn as the colon was fully examined. Estimated blood loss is zero unless otherwise noted in this procedure report.    Findings:  Normal digital rectal exam.  Fair bowel preparation diffusely; particulate stool, which could not be suctioned through the colonoscope, obscured multiple colonic views, subtle sessile lesions or other polypoid lesions < 12mm could have easily been missed.  Tight sigmoid colon in setting of multiple diverticula. With time, and use of pediatric colonoscope, these were traversed and the cecum was reached.  No polyps, masses, vascular ectasias, or inflammatory changes were seen, mindful of significant limitations from suboptimal bowel preparation.  Retroflexed view of rectum was normal.           Withdrawal time was 12 minutes     . The scope was withdrawn and the procedure completed.  COMPLICATIONS:  ENDOSCOPIC IMPRESSION:     As above.    Suspect at least part of abdominal pain could be from diverticular spasm.  No endoscopic evidence of diverticulitis.  Recent CT  scan without evidence of diverticulitis.  RECOMMENDATIONS:     1.  Watch for potential complications of procedure. 2.  High fiber diet indefinitely. 3.  Repeat colonoscopy in 5 years (fair, suboptimal prep today) with two-day bowel preparation. 4.  Follow-up with [facility] on as-needed basis.  eSigned:  Willis Modena, MD 06-Nov-2014 9:11 AM   cc:  CPT CODES: ICD CODES:  The ICD and CPT codes recommended by this software are interpretations from the data that the clinical staff has captured with the software.  The verification of the translation of this report to the ICD and CPT codes and modifiers is the sole responsibility of the health care institution and practicing physician where this report was generated.  [employer] will not be held responsible for the validity of the ICD and CPT codes included on this report.  AMA assumes no liability for data contained or not contained herein. CPT is a Publishing rights manager of the [employer].

## 2014-10-18 NOTE — H&P (View-Only) (Signed)
Encounter reviewed by Dr. Inioluwa Boulay Amundson C. Silva. Will need to return to Gastroenterology for consultation.   

## 2014-10-18 NOTE — Anesthesia Procedure Notes (Signed)
Procedure Name: MAC Date/Time: 10/18/2014 8:14 AM Performed by: Jarvis Newcomer A Pre-anesthesia Checklist: Patient identified, Timeout performed, Emergency Drugs available, Suction available and Patient being monitored Patient Re-evaluated:Patient Re-evaluated prior to inductionOxygen Delivery Method: Simple face mask Dental Injury: Teeth and Oropharynx as per pre-operative assessment

## 2014-10-19 ENCOUNTER — Encounter (HOSPITAL_COMMUNITY): Payer: Self-pay | Admitting: Gastroenterology

## 2015-04-23 ENCOUNTER — Ambulatory Visit (INDEPENDENT_AMBULATORY_CARE_PROVIDER_SITE_OTHER): Payer: Federal, State, Local not specified - PPO | Admitting: Nurse Practitioner

## 2015-04-23 ENCOUNTER — Encounter: Payer: Self-pay | Admitting: Nurse Practitioner

## 2015-04-23 VITALS — BP 136/64 | HR 80 | Ht 61.5 in | Wt 139.0 lb

## 2015-04-23 DIAGNOSIS — Z0000 Encounter for general adult medical examination without abnormal findings: Secondary | ICD-10-CM

## 2015-04-23 DIAGNOSIS — E559 Vitamin D deficiency, unspecified: Secondary | ICD-10-CM | POA: Diagnosis not present

## 2015-04-23 DIAGNOSIS — Z01419 Encounter for gynecological examination (general) (routine) without abnormal findings: Secondary | ICD-10-CM | POA: Diagnosis not present

## 2015-04-23 MED ORDER — VITAMIN D (ERGOCALCIFEROL) 1.25 MG (50000 UNIT) PO CAPS: 50000.0000 [IU] | ORAL_CAPSULE | ORAL | Status: DC

## 2015-04-23 NOTE — Progress Notes (Signed)
Patient ID: Brandy Brown, female   DOB: May 27, 1945, 70 y.o.   MRN: [id number]  70 y.o. G2P2002 Divorced  African American Fe here for annual exam.  Increase in night sweat and poor sleep.   No increase is stress and feels that this is just 'hormonal' related.  Patient's last menstrual period was 03/30/1989 (approximate).          Sexually active: Yes.    The current method of family planning is status post hysterectomy.    Exercising: No.  The patient does not participate in regular exercise at present. Smoker:  no  Health Maintenance: Pap: TAH, none on record MMG:04/27/14, 3D, Bi-Rads 1: Negative Colonoscopy: 10/18/14, Normal, repeat in 5 years (suboptimal prep) BMD:02/22/2004 T Score -1.3 Spine / -0.9 Left Hip Neck TDaP:03/26/10 Shingles: Never Pneumonia: Never Hep C and HIV: Hep C completed today, HIV not indicated due to age Labs: HB: 12.4  Urine: Negative   reports that she has never smoked. She has never used smokeless tobacco. She reports that she does not drink alcohol or use illicit drugs.  Past Medical History  Diagnosis Date  . Allergy   . Vitamin D deficiency   . STD (sexually transmitted disease)     HSV  . Arthritis     Past Surgical History  Procedure Laterality Date  . Cholecystectomy  1990's  . Abdominal hysterectomy  1991    Fibroids, AUB  . Tonsillectomy  age 69  . Colonoscopy with propofol N/A 10/18/2014    Procedure: COLONOSCOPY WITH PROPOFOL;  Surgeon: Willis Modena, MD;  Location: [facility];  Service: Endoscopy;  Laterality: N/A;  ultraslim scope     Current Outpatient Prescriptions  Medication Sig Dispense Refill  . valACYclovir (VALTREX) 1000 MG tablet Take 1 tablet by mouth daily.  3  . Vitamin D, Ergocalciferol, (DRISDOL) 50000 units CAPS capsule Take 1 capsule (50,000 Units total) by mouth once a week. 30 capsule 3   No current facility-administered medications for this visit.    Family History  Problem Relation Age of Onset  .  Diabetes Mother   . Heart attack Father   . Diabetes Brother     ROS:  Pertinent items are noted in HPI.  Otherwise, a comprehensive ROS was negative.  Exam:   BP 136/64 mmHg  Pulse 80  Ht 5' 1.5" (1.562 m)  Wt 139 lb (63.05 kg)  BMI 25.84 kg/m2  LMP 03/30/1989 (Approximate) Height: 5' 1.5" (156.2 cm) Ht Readings from Last 3 Encounters:  04/23/15 5' 1.5" (1.562 m)  10/18/14  (1.549 m)  04/19/14 5' 1.75" (1.568 m)    General appearance: alert, cooperative and appears stated age Head: Normocephalic, without obvious abnormality, atraumatic Neck: no adenopathy, supple, symmetrical, trachea midline and thyroid normal to inspection and palpation Lungs: clear to auscultation bilaterally Breasts: normal appearance, no masses or tenderness Heart: regular rate and rhythm Abdomen: soft, non-tender; no masses,  no organomegaly Extremities: extremities normal, atraumatic, no cyanosis or edema Skin: Skin color, texture, turgor normal. No rashes or lesions Lymph nodes: Cervical, supraclavicular, and axillary nodes normal. No abnormal inguinal nodes palpated Neurologic: Grossly normal   Pelvic: External genitalia:  no lesions              Urethra:  normal appearing urethra with no masses, tenderness or lesions              Bartholin's and Skene's: normal  Vagina: normal appearing vagina with normal color and discharge, no lesions              Cervix: absent              Pap taken: No. Bimanual Exam:  Uterus:  uterus absent              Adnexa: no mass, fullness, tenderness               Rectovaginal: Confirms               Anus:  normal sphincter tone, no lesions  Chaperone present: no  A:  Well Woman with normal exam  S/P TAH secondary to fibroids and AUB 1991 History of HSV I/II + IgG History of Vit D deficiency   P:   Reviewed health and wellness pertinent to exam  Pap smear as above  Mammogram is due this month and  will schedule  She will try Benadryl prn for sleep and Remifemin for vaso symptoms  Counseled on breast self exam, mammography screening, osteoporosis, adequate intake of calcium and vitamin D, diet and exercise, Kegel's exercises return annually or prn  An After Visit Summary was printed and given to the patient.

## 2015-04-23 NOTE — Patient Instructions (Signed)

## 2015-04-24 LAB — VITAMIN D 25 HYDROXY (VIT D DEFICIENCY, FRACTURES): Vit D, 25-Hydroxy: 54 ng/mL (ref 30–100)

## 2015-04-24 LAB — HEPATITIS C ANTIBODY: HCV AB: NEGATIVE

## 2015-04-25 ENCOUNTER — Telehealth: Payer: Self-pay | Admitting: *Deleted

## 2015-04-25 NOTE — Telephone Encounter (Signed)
I have attempted to contact this patient by phone with the following results: left message to return call to [place] at [phone] answering machine (home per [facility]).  No personal information given.  [phone] (Home)

## 2015-04-25 NOTE — Telephone Encounter (Signed)
-----   Message from Ria Comment, FNP sent at 04/24/2015  8:32 AM EST ----- Please let pt know that Vit D is at 54 - very good.  She may either go to RX Vit D at every other week or take OTC 1000 IU daily to maintain.  The Hep C was negative as expected.

## 2015-04-25 NOTE — Telephone Encounter (Signed)
Pt notified in result note.  Closing encounter. 

## 2015-04-28 NOTE — Progress Notes (Signed)
Encounter reviewed by Dr. Vermell Madrid Amundson C. Silva.  

## 2015-05-15 ENCOUNTER — Other Ambulatory Visit: Payer: Self-pay | Admitting: Nurse Practitioner

## 2015-05-15 NOTE — Telephone Encounter (Signed)
Medication refill request: Valtrex Last AEX:  04-23-15 Next AEX: 04-28-16 Last MMG (if hormonal medication request): 04-27-14 WNL Refill authorized: please advise

## 2015-06-13 ENCOUNTER — Other Ambulatory Visit: Payer: Self-pay

## 2015-06-13 DIAGNOSIS — Z1231 Encounter for screening mammogram for malignant neoplasm of breast: Secondary | ICD-10-CM

## 2015-06-19 ENCOUNTER — Ambulatory Visit
Admission: RE | Admit: 2015-06-19 | Discharge: 2015-06-19 | Disposition: A | Discharge status: Home / Self Care | Payer: Federal, State, Local not specified - PPO | Source: Ambulatory Visit

## 2015-06-19 DIAGNOSIS — Z1231 Encounter for screening mammogram for malignant neoplasm of breast: Secondary | ICD-10-CM

## 2015-07-11 ENCOUNTER — Other Ambulatory Visit: Payer: Self-pay | Admitting: Nurse Practitioner

## 2015-07-11 NOTE — Telephone Encounter (Signed)
Phone call LMTCB by Judeth CornfieldStephanie to determine if she was to stay on Rx or OTC, not returned as yet. Will not fill RX at this point.

## 2015-07-11 NOTE — Telephone Encounter (Signed)
Medication refill request: Vitamin D Last AEX:  04/23/15 with PG Next AEX: 04/28/2016 with PG  Last Vitamin D level checked: 04/23/15 at 54 Refill authorized: Please advise  [facility] De[facility] of the [facility].

## 2015-08-04 ENCOUNTER — Ambulatory Visit (HOSPITAL_COMMUNITY)
Admission: EM | Admit: 2015-08-04 | Discharge: 2015-08-04 | Disposition: A | Discharge status: Home / Self Care | Payer: Federal, State, Local not specified - PPO | Attending: Family Medicine | Admitting: Family Medicine

## 2015-08-04 ENCOUNTER — Encounter (HOSPITAL_COMMUNITY): Payer: Self-pay | Admitting: Emergency Medicine

## 2015-08-04 DIAGNOSIS — R1030 Lower abdominal pain, unspecified: Secondary | ICD-10-CM

## 2015-08-04 DIAGNOSIS — K921 Melena: Secondary | ICD-10-CM

## 2015-08-04 LAB — POCT I-STAT, CHEM 8
BUN: 11 mg/dL (ref 6–20)
CHLORIDE: 104 mmol/L (ref 101–111)
CREATININE: 0.8 mg/dL (ref 0.44–1.00)
Calcium, Ion: 1.13 mmol/L (ref 1.13–1.30)
Glucose, Bld: 80 mg/dL (ref 65–99)
HEMATOCRIT: 38 % (ref 36.0–46.0)
Hemoglobin: 12.9 g/dL (ref 12.0–15.0)
POTASSIUM: 3.8 mmol/L (ref 3.5–5.1)
SODIUM: 140 mmol/L (ref 135–145)
TCO2: 25 mmol/L (ref 0–100)

## 2015-08-04 LAB — OCCULT BLOOD, POC DEVICE: Fecal Occult Bld: POSITIVE — AB

## 2015-08-04 NOTE — Discharge Instructions (Signed)
Abdominal Pain, Adult No food for now, just liquids If worse, heavier bleeding or worsening pain go to the ED Many things can cause abdominal pain. Usually, abdominal pain is not caused by a disease and will improve without treatment. It can often be observed and treated at home. Your health care provider will do a physical exam and possibly order blood tests and X-rays to help determine the seriousness of your pain. However, in many cases, more time must pass before a clear cause of the pain can be found. Before that point, your health care provider may not know if you need more testing or further treatment. HOME CARE INSTRUCTIONS Monitor your abdominal pain for any changes. The following actions may help to alleviate any discomfort you are experiencing:  Only take over-the-counter or prescription medicines as directed by your health care provider.  Do not take laxatives unless directed to do so by your health care provider.  Try a clear liquid diet (broth, tea, or water) as directed by your health care provider. Slowly move to a bland diet as tolerated. SEEK MEDICAL CARE IF:  You have unexplained abdominal pain.  You have abdominal pain associated with nausea or diarrhea.  You have pain when you urinate or have a bowel movement.  You experience abdominal pain that wakes you in the night.  You have abdominal pain that is worsened or improved by eating food.  You have abdominal pain that is worsened with eating fatty foods.  You have a fever. SEEK IMMEDIATE MEDICAL CARE IF:  Your pain does not go away within 2 hours.  You keep throwing up (vomiting).  Your pain is felt only in portions of the abdomen, such as the right side or the left lower portion of the abdomen.  You pass bloody or black tarry stools. MAKE SURE YOU:  Understand these instructions.  Will watch your condition.  Will get help right away if you are not doing well or get worse.   This information is not  intended to replace advice given to you by your health care provider. Make sure you discuss any questions you have with your health care provider.   Document Released: 12/24/2004 Document Revised: 12/05/2014 Document Reviewed: 11/23/2012 Elsevier Interactive Patient Education 2016 Elsevier Inc.  Gastrointestinal Bleeding Gastrointestinal bleeding is bleeding somewhere along the path that food travels through the body (digestive tract). This path is anywhere between the mouth and the opening of the butt (anus). You may have blood in your throw up (vomit) or in your poop (stools). If there is a lot of bleeding, you may need to stay in the hospital. HOME CARE  Only take medicine as told by your doctor.  Eat foods with fiber such as whole grains, fruits, and vegetables. You can also try eating 1 to 3 prunes a day.  Drink enough fluids to keep your pee (urine) clear or pale yellow. GET HELP RIGHT AWAY IF:   Your bleeding gets worse.  You feel dizzy, weak, or you pass out (faint).  You have bad cramps in your back or belly (abdomen).  You have large blood clumps (clots) in your poop.  Your problems are getting worse. MAKE SURE YOU:   Understand these instructions.  Will watch your condition.  Will get help right away if you are not doing well or get worse.   This information is not intended to replace advice given to you by your health care provider. Make sure you discuss any questions you have with  your health care provider.   Document Released: 12/24/2007 Document Revised: 03/02/2012 Document Reviewed: 09/03/2014 Elsevier Interactive Patient Education [employer] Elsevier Inc.

## 2015-08-04 NOTE — ED Provider Notes (Signed)
CSN: [id number]     Arrival date & time 08/04/15  1259 History   First MD Initiated Contact with Patient 08/04/15 1310     Chief Complaint  Patient presents with  . Abdominal Pain  . Rectal Bleeding   (Consider location/radiation/quality/duration/timing/severity/associated sxs/prior Treatment) HPI Comments: 70 year old female complaining of abdominal pain for 2 weeks. The pain is located in the lower mid to right lower quadrant. It is intermittent but becoming more frequent in the last few days. She noticed last evening and this morning when having bowel movements there was associated bleeding. She describes the pain as crampy and intense. She occasionally has nausea and has had an episode of vomiting 2 days ago but not since. Her appetite and her oral intake has been decreased over the past few days. Denies fever, chills, chest pain, shortness of breath. It is noteworthy that she had a colonoscopy in July 2016. The complaint was lower abdominal pain at that time. There was no recording of the outcome of that test however the patient's stated that she was told everything was normal with no abnormalities.   Past Medical History  Diagnosis Date  . Allergy   . Vitamin D deficiency   . STD (sexually transmitted disease)     HSV  . Arthritis    Past Surgical History  Procedure Laterality Date  . Cholecystectomy  1990's  . Abdominal hysterectomy  1991    Fibroids, AUB  . Tonsillectomy  age 54  . Colonoscopy with propofol N/A 10/18/2014    Procedure: COLONOSCOPY WITH PROPOFOL;  Surgeon: Willis Modena, MD;  Location: [facility];  Service: Endoscopy;  Laterality: N/A;  ultraslim scope    Family History  Problem Relation Age of Onset  . Diabetes Mother   . Heart attack Father   . Diabetes Brother    Social History  Substance Use Topics  . Smoking status: Never Smoker   . Smokeless tobacco: Never Used  . Alcohol Use: No   OB History    Gravida Para Term Preterm AB TAB SAB Ectopic  Multiple Living   0 0 0 0 0 0 2     Review of Systems  Constitutional: Positive for activity change and appetite change. Negative for fever and chills.  HENT: Negative.   Respiratory: Negative.  Negative for cough and shortness of breath.   Cardiovascular: Negative for chest pain and leg swelling.  Gastrointestinal: Positive for nausea, abdominal pain and blood in stool. Negative for diarrhea and constipation.  Genitourinary: Negative.   Musculoskeletal: Negative.   Skin: Negative.   Neurological: Negative.   Psychiatric/Behavioral: Negative.   All other systems reviewed and are negative.   Allergies  Sulfa drugs cross reactors  Home Medications   Prior to Admission medications   Medication Sig Start Date End Date Taking? Authorizing Provider  valACYclovir (VALTREX) 1000 MG tablet Take 1 tablet by mouth daily. 03/21/15   Historical Provider, MD  valACYclovir (VALTREX) 1000 MG tablet TAKE 1 TABLET BY MOUTH EVERY DAY 05/15/15   Ria Comment, FNP  Vitamin D, Ergocalciferol, (DRISDOL) 50000 units CAPS capsule Take 1 capsule (50,000 Units total) by mouth once a week. 04/23/15   Ria Comment, FNP   Meds Ordered and Administered this Visit  Medications - No data to display  BP 195/90 mmHg  Pulse 85  Temp(Src) 97.9 F (36.6 C) (Oral)  Resp 18  SpO2 98%  LMP 03/30/1989 (Approximate) No data found.   Physical Exam  Constitutional: She is oriented  to person, place, and time. She appears well-developed and well-nourished. No distress.  Eyes: EOM are normal.  Neck: Normal range of motion. Neck supple.  Cardiovascular: Normal rate, regular rhythm and normal heart sounds.   Pulmonary/Chest: Effort normal and breath sounds normal. No respiratory distress. She has no wheezes. She has no rales.  Abdominal: Soft. Bowel sounds are normal. She exhibits no distension.  Tenderness to the lower mid abdomen, lesser tenderness to the right lower quadrant. Mild tenderness to the  epigastrium. No right upper quadrant or left upper quadrant tenderness. Most of the abdomen percusses tympanic. Dull in the lower quadrants.  Musculoskeletal: She exhibits no edema.  Neurological: She is alert and oriented to person, place, and time. She exhibits normal muscle tone. Coordination normal.  Skin: Skin is warm and dry.  Psychiatric: She has a normal mood and affect.  Nursing note and vitals reviewed.   ED Course  Procedures (including critical care time)  Labs Review Labs Reviewed  OCCULT BLOOD, POC DEVICE - Abnormal; Notable for the following:    Fecal Occult Bld POSITIVE (*)    All other components within normal limits  POCT I-STAT, CHEM 8   Results for orders placed or performed during the hospital encounter of 08/04/15  Occult blood, poc device  Result Value Ref Range   Fecal Occult Bld POSITIVE (A) NEGATIVE  I-STAT, chem 8  Result Value Ref Range   Sodium 140 135 - 145 mmol/L   Potassium 3.8 3.5 - 5.1 mmol/L   Chloride 104 101 - 111 mmol/L   BUN 11 6 - 20 mg/dL   Creatinine, Ser 1.610.80 0.44 - 1.00 mg/dL   Glucose, Bld 80 65 - 99 mg/dL   Calcium, Ion 0.961.13 0.451.13 - 1.30 mmol/L   TCO2 25 0 - 100 mmol/L   Hemoglobin 12.9 12.0 - 15.0 g/dL   HCT 40.938.0 81.136.0 - 91.446.0 %     Imaging Review No results found.   Visual Acuity Review  Right Eye Distance:   Left Eye Distance:   Bilateral Distance:    Right Eye Near:   Left Eye Near:    Bilateral Near:         MDM   1. Lower abdominal pain   2. Blood in stool    Patient is stable. Vital signs are normal. Hemoglobin and hematocrit are normal. Patient's pain is mild to moderate and intermittent. She is relatively comfortable at this time. She is to follow-up with her PCP or gastroenterologist tomorrow. She is to call tomorrow morning for an appointment. She is aware that she does have blood in the stool. No food for now, just liquids If worse, heavier bleeding or worsening pain go to the ED     Hayden Rasmussenavid Airabella Barley,  NP 08/04/15 1438

## 2015-08-04 NOTE — ED Notes (Signed)
C/o abd pain onset x2 weeks... Also reports she's had x2 episodes of bloody stools w/BM, fevers, and decreased appetite A&O x4... No acute distress.

## 2015-08-05 ENCOUNTER — Telehealth: Payer: Self-pay | Admitting: Nurse Practitioner

## 2015-08-05 ENCOUNTER — Other Ambulatory Visit: Payer: Self-pay | Admitting: Nurse Practitioner

## 2015-08-05 DIAGNOSIS — K922 Gastrointestinal hemorrhage, unspecified: Secondary | ICD-10-CM

## 2015-08-05 NOTE — Telephone Encounter (Signed)
I met with the patient and reviewed her ED note.  She had normal HGB and labs.  She states the blood is dark black in color with clumps - most likely clots.  She has no new symptoms since yesterday.  We have put in a referral for her to see [facility] and will be notified of apt.  She is stable and no signs of active bleeding today.

## 2015-08-05 NOTE — Telephone Encounter (Signed)
Patient walked into the office to get a referral for a Gastroenterology. She states she was seen in the ER this weekend and has a abdominal pain and blood in her stool.

## 2016-01-20 ENCOUNTER — Other Ambulatory Visit: Payer: Self-pay | Admitting: Nurse Practitioner

## 2016-01-20 NOTE — Telephone Encounter (Signed)
Spoke with pharmacy, they have enough refills on Valtrex. Rx is being prepared. Patient has been notified.

## 2016-01-20 NOTE — Telephone Encounter (Signed)
Patient would like a refill for valtrex sent to [facility] on [address]. Phone [phone].

## 2016-01-20 NOTE — Telephone Encounter (Signed)
Medication refill request: Valtrex 1000mg  Last AEX:  04/23/15 PG Next AEX: 04/28/16 Last MMG (if hormonal medication request): 06/19/15 BIRADS1 negative Refill authorized: 05/15/15 #90 w/3 refills; today please advise

## 2016-04-28 ENCOUNTER — Ambulatory Visit: Payer: Federal, State, Local not specified - PPO | Admitting: Nurse Practitioner

## 2016-05-08 ENCOUNTER — Encounter: Payer: Self-pay | Admitting: Nurse Practitioner

## 2016-05-11 ENCOUNTER — Encounter: Payer: Self-pay | Admitting: Nurse Practitioner

## 2016-05-11 ENCOUNTER — Ambulatory Visit (INDEPENDENT_AMBULATORY_CARE_PROVIDER_SITE_OTHER): Payer: Federal, State, Local not specified - PPO | Admitting: Nurse Practitioner

## 2016-05-11 VITALS — BP 130/86 | HR 80 | Ht 61.5 in | Wt 136.0 lb

## 2016-05-11 DIAGNOSIS — E559 Vitamin D deficiency, unspecified: Secondary | ICD-10-CM

## 2016-05-11 DIAGNOSIS — Z01411 Encounter for gynecological examination (general) (routine) with abnormal findings: Secondary | ICD-10-CM

## 2016-05-11 DIAGNOSIS — Z0000 Encounter for general adult medical examination without abnormal findings: Secondary | ICD-10-CM | POA: Diagnosis not present

## 2016-05-11 DIAGNOSIS — Z01419 Encounter for gynecological examination (general) (routine) without abnormal findings: Secondary | ICD-10-CM

## 2016-05-11 NOTE — Progress Notes (Signed)
Patient ID: Brandy Brown, female   DOB: 05/28/1945, 71 y.o.   MRN: [id number]  71 y.o. G2P2002 Divorced  African American Fe here for annual exam.  Still has some vaso symptoms - worse at night.  Mother age 23 is still working 4 hours a day - 5 days a week.  Patient's last menstrual period was 03/30/1989 (approximate).          Sexually active: Yes.    The current method of family planning is status post hysterectomy.    Exercising: No.  The patient does not participate in regular exercise at present. Smoker:  no  Health Maintenance: Pap: TAH, none on record MMG: 06/19/15, 3D, Bi-Rads 1: Negative Colonoscopy: 10/18/14, Normal, repeat in 5 years (sub optimal prep) BMD:02/22/2004 T Score, -1.3 Spine / -0.9 Left Femur Neck TDaP:03/26/10 Shingles: Never Pneumonia: Never Hep C: 04/23/15 Labs: PCP takes care of labs   reports that she has never smoked. She has never used smokeless tobacco. She reports that she does not drink alcohol or use drugs.  Past Medical History:  Diagnosis Date  . Allergy   . Arthritis   . STD (sexually transmitted disease)    HSV  . Vitamin D deficiency     Past Surgical History:  Procedure Laterality Date  . ABDOMINAL HYSTERECTOMY  1991   Fibroids, AUB  . CHOLECYSTECTOMY  1990's  . COLONOSCOPY WITH PROPOFOL N/A 10/18/2014   Procedure: COLONOSCOPY WITH PROPOFOL;  Surgeon: Willis Modena, MD;  Location: [facility];  Service: Endoscopy;  Laterality: N/A;  ultraslim scope   . TONSILLECTOMY  age 73    Current Outpatient Prescriptions  Medication Sig Dispense Refill  . Multiple Vitamin (MULTIVITAMIN) tablet Take 1 tablet by mouth daily.    . valACYclovir (VALTREX) 1000 MG tablet TAKE 1 TABLET BY MOUTH EVERY DAY 90 tablet 3   No current facility-administered medications for this visit.     Family History  Problem Relation Age of Onset  . Diabetes Mother   . Kidney disease Mother     only has one kidney  . Heart attack Father   . Diabetes Brother      ROS:  Pertinent items are noted in HPI.  Otherwise, a comprehensive ROS was negative.  Exam:   BP 130/86 (BP Location: Right Arm, Patient Position: Sitting, Cuff Size: Normal)   Pulse 80   Ht 5' 1.5" (1.562 m)   Wt 136 lb (61.7 kg)   LMP 03/30/1989 (Approximate)   BMI 25.28 kg/m  Height: 5' 1.5" (156.2 cm) Ht Readings from Last 3 Encounters:  05/11/16 5' 1.5" (1.562 m)  04/23/15 5' 1.5" (1.562 m)  10/18/14 5\' 1"  (1.549 m)    General appearance: alert, cooperative and appears stated age Head: Normocephalic, without obvious abnormality, atraumatic Neck: no adenopathy, supple, symmetrical, trachea midline and thyroid normal to inspection and palpation Lungs: clear to auscultation bilaterally Breasts: normal appearance, no masses or tenderness Heart: regular rate and rhythm Abdomen: soft, non-tender; no masses,  no organomegaly Extremities: extremities normal, atraumatic, no cyanosis or edema Skin: Skin color, texture, turgor normal. No rashes or lesions Lymph nodes: Cervical, supraclavicular, and axillary nodes normal. No abnormal inguinal nodes palpated Neurologic: Grossly normal   Pelvic: External genitalia:  no lesions              Urethra:  normal appearing urethra with no masses, tenderness or lesions              Bartholin's and Skene's: normal  Vagina: normal appearing vagina with normal color and discharge, no lesions              Cervix: absent              Pap taken: No. Bimanual Exam:  Uterus:  uterus absent              Adnexa: no mass, fullness, tenderness               Rectovaginal: Confirms               Anus:  normal sphincter tone, no lesions  Chaperone present: no  A:  Well Woman with normal exam   S/P TAH secondary to fibroids and AUB 1991 History of HSV I/II + IgG History of Vit D deficiency   P:   Reviewed health and wellness pertinent to exam  Pap smear not done  Mammogram is due 05/2016  Declines  BMD  Will check at pharmacy about Shingles and Prevnar 13  Does not need a refill on Valtrex at this time  Will follow with Vit D  - currently off Vit D RX x 1 yr.  Counseled on breast self exam, mammography screening, osteoporosis, adequate intake of calcium and vitamin D, diet and exercise, Kegel's exercises return annually or prn  An After Visit Summary was printed and given to the patient.

## 2016-05-11 NOTE — Patient Instructions (Addendum)

## 2016-05-12 LAB — VITAMIN D 25 HYDROXY (VIT D DEFICIENCY, FRACTURES): VIT D 25 HYDROXY: 47 ng/mL (ref 30–100)

## 2016-05-12 NOTE — Progress Notes (Signed)
Encounter reviewed by Dr. Brook Amundson C. Silva.  

## 2016-06-22 ENCOUNTER — Other Ambulatory Visit: Payer: Self-pay | Admitting: Nurse Practitioner

## 2016-06-22 DIAGNOSIS — Z1231 Encounter for screening mammogram for malignant neoplasm of breast: Secondary | ICD-10-CM

## 2016-07-09 ENCOUNTER — Ambulatory Visit
Admission: RE | Admit: 2016-07-09 | Discharge: 2016-07-09 | Disposition: A | Discharge status: Home / Self Care | Payer: Federal, State, Local not specified - PPO | Source: Ambulatory Visit | Attending: Nurse Practitioner | Admitting: Nurse Practitioner

## 2016-07-09 ENCOUNTER — Other Ambulatory Visit: Payer: Self-pay | Admitting: Nurse Practitioner

## 2016-07-09 DIAGNOSIS — Z1231 Encounter for screening mammogram for malignant neoplasm of breast: Secondary | ICD-10-CM

## 2016-07-10 NOTE — Telephone Encounter (Signed)
Medication refill request: valACYclovir  Last AEX:  05/11/16 PG Next AEX: 05/12/17 Last MMG (if hormonal medication request): 07/09/16 BIRADS 1 negative/density b Refill authorized: 05/15/15 #90 w/3 refills; today please advise

## 2016-09-25 IMAGING — CT CT ABD-PELV W/ CM
3 of 5 series · 13 of 36 positions shown, 19 images · IV contrast (iopamidol)
Comparison: None.

CLINICAL DATA: 69-year-old female with lower abdominal pain and
bloating for 3-4 weeks. Evaluate for diverticulitis. Prior
cholecystectomy and partial hysterectomy. No history of cancer.

EXAM:
CT ABDOMEN AND PELVIS WITH CONTRAST
TECHNIQUE: Multidetector CT imaging of the abdomen and pelvis was performed
using the standard protocol following bolus administration of
intravenous contrast.
CONTRAST:  100mL 6ZC2DQ-1NN IOPAMIDOL (6ZC2DQ-1NN) INJECTION 61%

[Series 3: abd/pelvis with · axial · 0.75mm/px · z∈[-333,-33]mm · 7 of 81 slices shown, 12 images]
[im 11/81  soft-tissue]
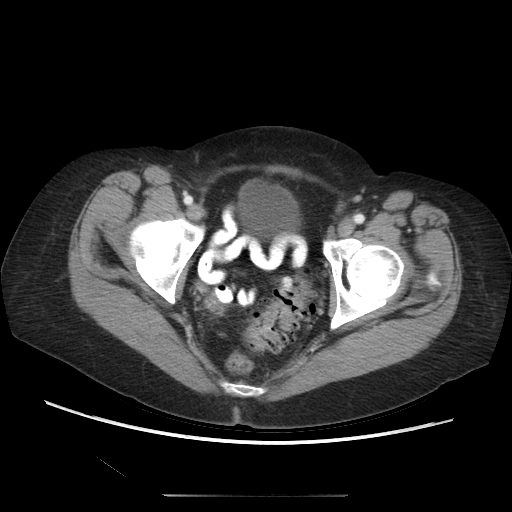
[im 11/81  bone]
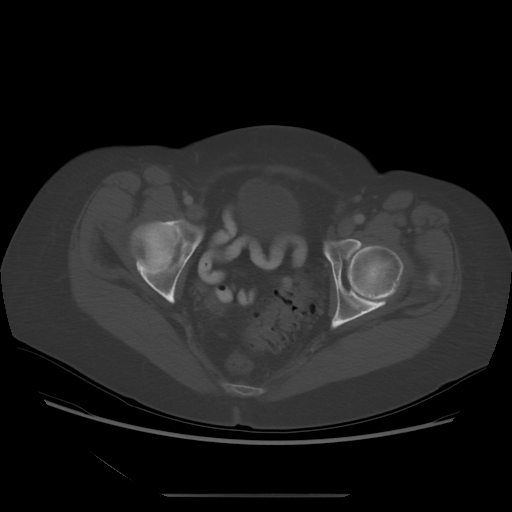
[im 21/81  soft-tissue]
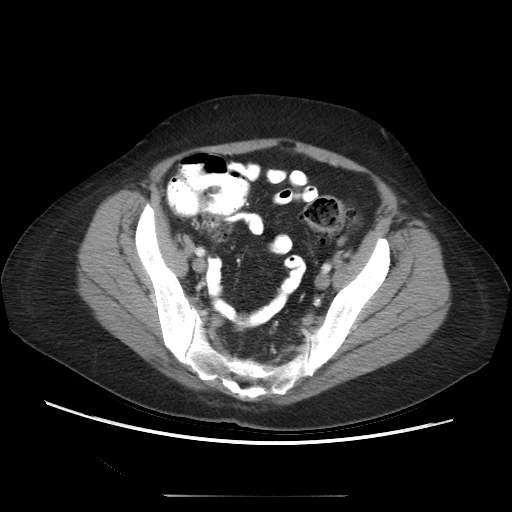
[im 31/81  soft-tissue]
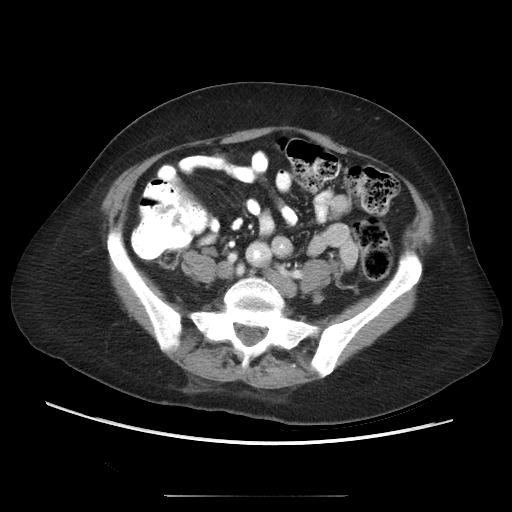
[im 41/81  soft-tissue]
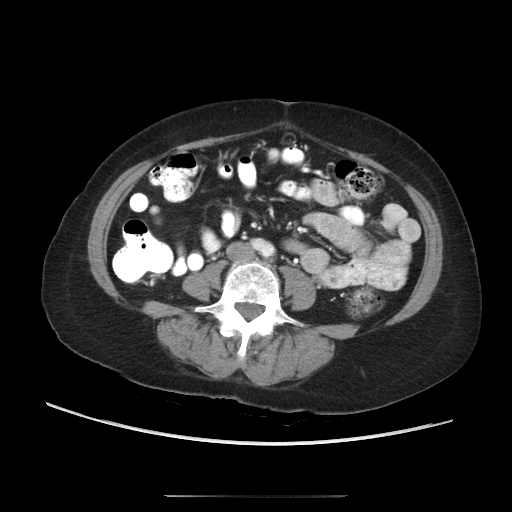
[im 41/81  lung]
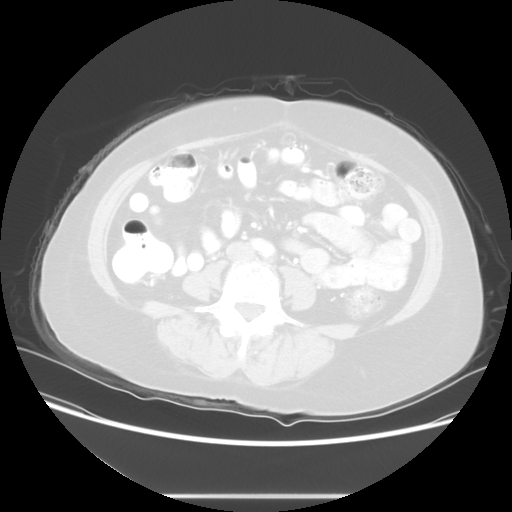
[im 51/81  soft-tissue]
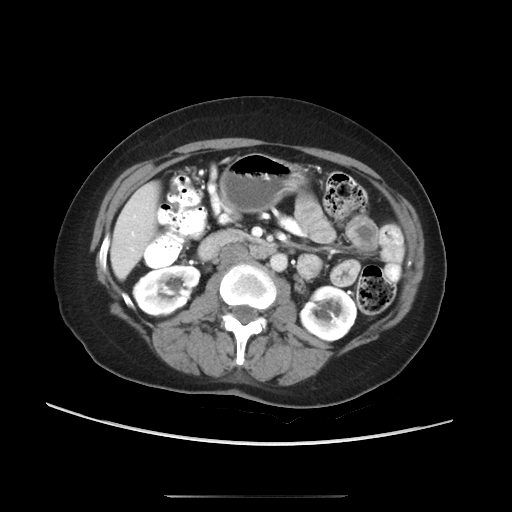
[im 51/81  lung]
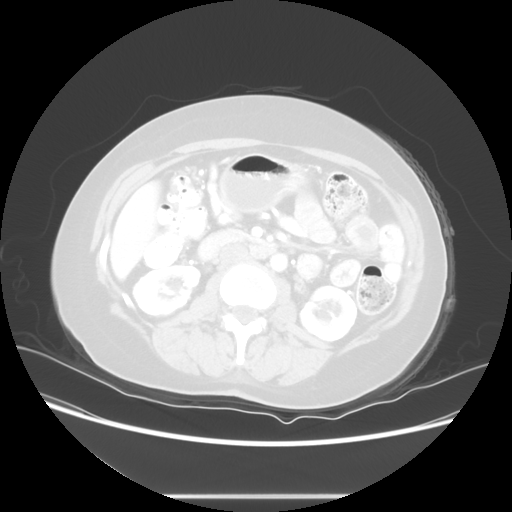
[im 61/81  soft-tissue]
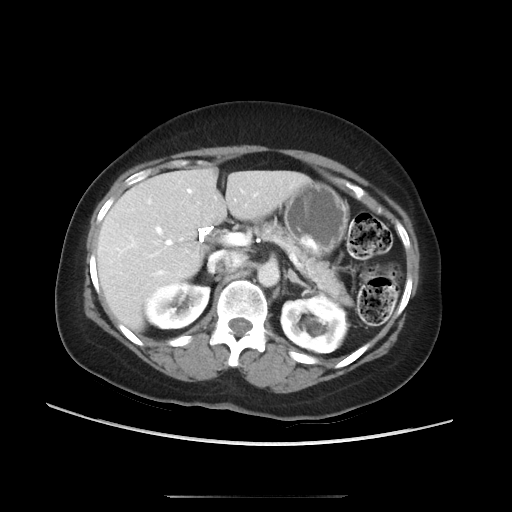
[im 61/81  lung]
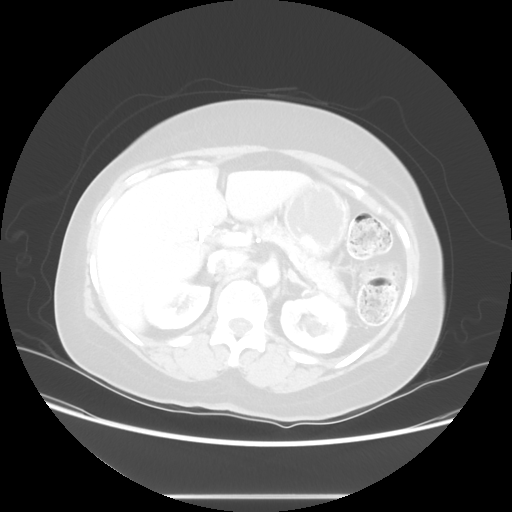
[im 71/81  soft-tissue]
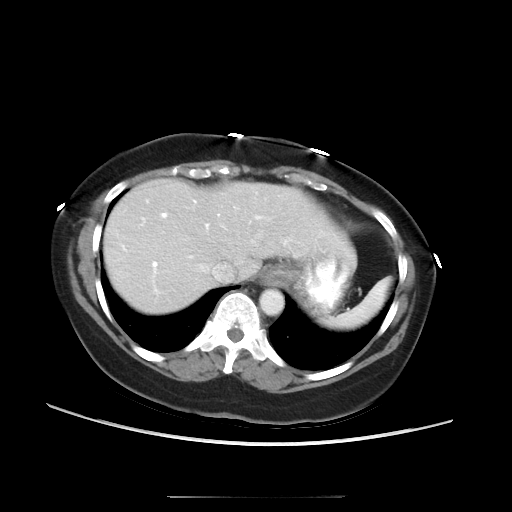
[im 71/81  lung]
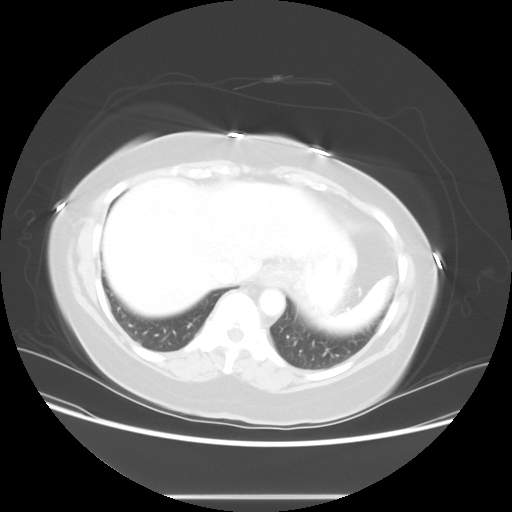

[Series 601: coronal body · coronal · 0.84mm/px · 1 of 108 slices shown, 2 images]
[im 36/108  soft-tissue]
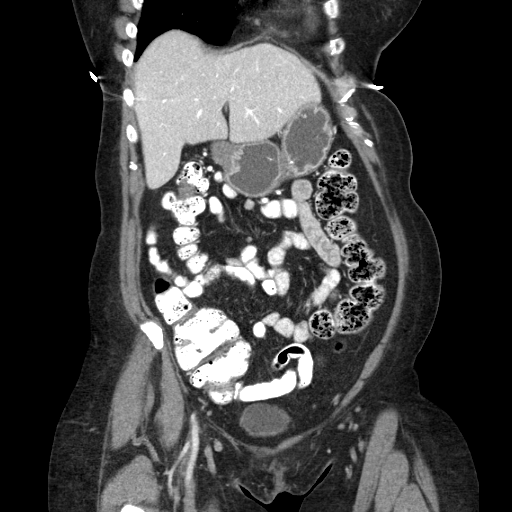
[im 36/108  bone]
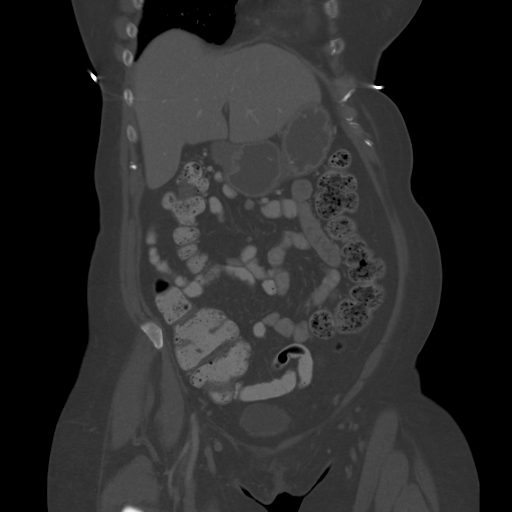

[Series 602: sagittal body · sagittal · 0.84mm/px · 5 of 156 slices shown]
[im 10/156  soft-tissue]
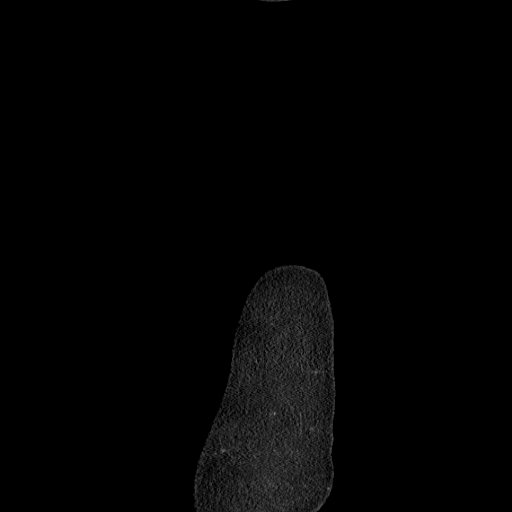
[im 30/156  soft-tissue]
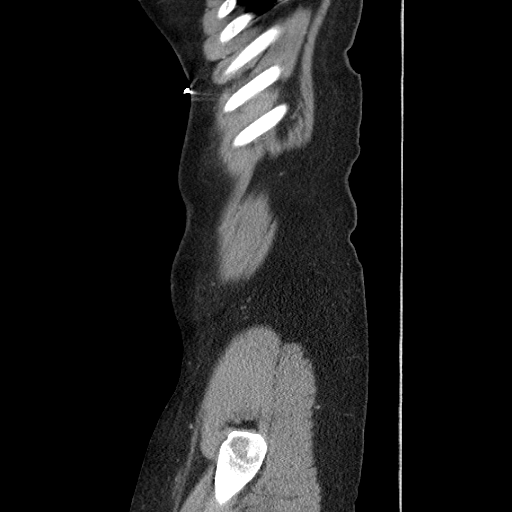
[im 49/156  soft-tissue]
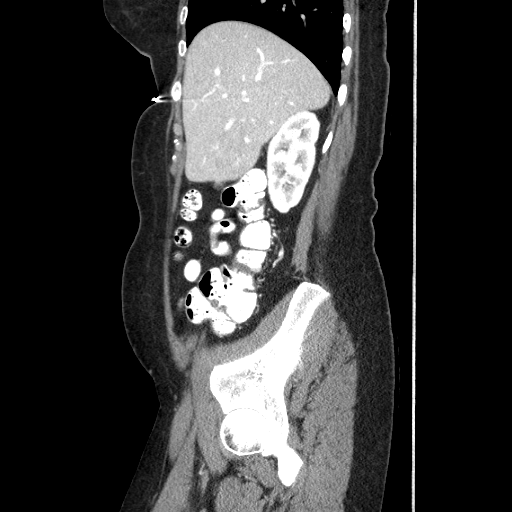
[im 68/156  soft-tissue]
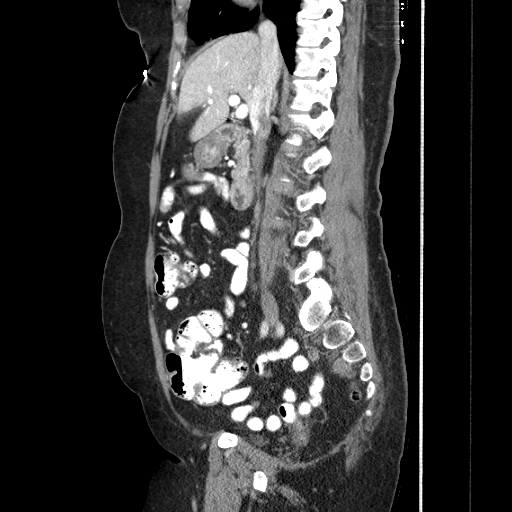
[im 88/156  soft-tissue]
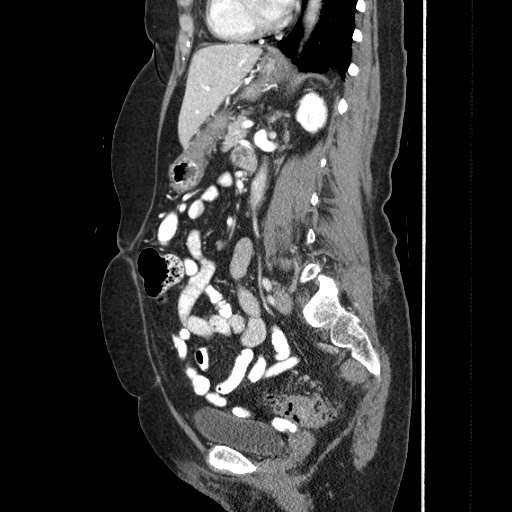

[13 of 36 positions shown; findings below may reference images not displayed]

FINDINGS: Prominent diverticula and muscular hypertrophy of the sigmoid colon
without extraluminal bowel inflammatory process, free fluid or free
air.

No worrisome hepatic, splenic, pancreatic, adrenal or renal lesion.
Slightly lobulated contour of the kidneys may reflect normal finding
versus result of prior scarring. Extra renal type pelvis on the
left. Postcholecystectomy.

Mild diastases rectus muscles.

No abdominal aortic aneurysm.

No adenopathy.

No osseous destructive lesion.

Post hysterectomy. Coarse calcification anterior left pelvis felt to
be related to the left ovary.

Under distended noncontrast filled views of the urinary bladder
unremarkable.

Lung bases clear.  Heart size top-normal.
IMPRESSION: Prominent diverticula and muscular hypertrophy of the sigmoid colon
without extraluminal bowel inflammatory process, free fluid or free
air.

Please see above.

## 2017-05-12 ENCOUNTER — Ambulatory Visit (INDEPENDENT_AMBULATORY_CARE_PROVIDER_SITE_OTHER): Payer: Federal, State, Local not specified - PPO | Admitting: Obstetrics and Gynecology

## 2017-05-12 ENCOUNTER — Other Ambulatory Visit: Payer: Self-pay

## 2017-05-12 ENCOUNTER — Ambulatory Visit: Payer: Federal, State, Local not specified - PPO | Admitting: Nurse Practitioner

## 2017-05-12 ENCOUNTER — Encounter: Payer: Self-pay | Admitting: Obstetrics and Gynecology

## 2017-05-12 VITALS — BP 130/80 | HR 84 | Resp 14 | Ht 61.0 in | Wt 135.0 lb

## 2017-05-12 DIAGNOSIS — E559 Vitamin D deficiency, unspecified: Secondary | ICD-10-CM | POA: Diagnosis not present

## 2017-05-12 DIAGNOSIS — N951 Menopausal and female climacteric states: Secondary | ICD-10-CM

## 2017-05-12 DIAGNOSIS — Z01419 Encounter for gynecological examination (general) (routine) without abnormal findings: Secondary | ICD-10-CM

## 2017-05-12 DIAGNOSIS — M8580 Other specified disorders of bone density and structure, unspecified site: Secondary | ICD-10-CM | POA: Diagnosis not present

## 2017-05-12 DIAGNOSIS — Z0000 Encounter for general adult medical examination without abnormal findings: Secondary | ICD-10-CM

## 2017-05-12 NOTE — Progress Notes (Signed)
72 y.o. [id number] DivorcedAfrican AmericanF here for annual exam.  H/O hysterectomy. Not sexually active. She has been having vasomotor symptoms for many years. She has 5-6 hot flashes a day, wake her up at night 2 x a night. Feels tired. Avoids triggers.  She is on daily HSV, no outbreaks for 2 years.     Patient's last menstrual period was 03/30/1989 (approximate).          Sexually active: No.  The current method of family planning is post menopausal status.    Exercising: Yes.    walking Smoker:  no  Health Maintenance: Pap:  Unsure History of abnormal Pap:  no MMG:  07-09-16 WNL  Colonoscopy:  2016 normal per patient  BMD:   2005 Osteopenia TDaP:  03-26-10 Gardasil: N/A   reports that  has never smoked. she has never used smokeless tobacco. She reports that she does not drink alcohol or use drugs. Retired. 2 grown children, one lives in [place], other is local. One grand daughter, local, 3230.   Past Medical History:  Diagnosis Date  . Allergy   . Arthritis   . STD (sexually transmitted disease)    HSV  . Vitamin D deficiency     Past Surgical History:  Procedure Laterality Date  . ABDOMINAL HYSTERECTOMY  1991   Fibroids, AUB  . CHOLECYSTECTOMY  1990's  . COLONOSCOPY WITH PROPOFOL N/A 10/18/2014   Procedure: COLONOSCOPY WITH PROPOFOL;  Surgeon: Willis ModenaWilliam Outlaw, MD;  Location: [facility];  Service: Endoscopy;  Laterality: N/A;  ultraslim scope   . TONSILLECTOMY  age 72    Current Outpatient Medications  Medication Sig Dispense Refill  . Multiple Vitamin (MULTIVITAMIN) tablet Take 1 tablet by mouth daily.    . valACYclovir (VALTREX) 1000 MG tablet TAKE 1 TABLET BY MOUTH EVERY DAY 90 tablet 3   No current facility-administered medications for this visit.     Family History  Problem Relation Age of Onset  . Diabetes Mother   . Kidney disease Mother        only has one kidney  . Heart attack Father   . Diabetes Brother     Review of Systems  Constitutional:  Negative.   HENT: Negative.   Eyes: Negative.   Respiratory: Negative.   Cardiovascular: Negative.   Gastrointestinal: Negative.   Endocrine: Negative.   Genitourinary:       Hot flashes Night sweats  Night urination   Musculoskeletal: Negative.   Skin: Negative.   Allergic/Immunologic: Negative.   Neurological: Negative.   Psychiatric/Behavioral: Negative.     Exam:   BP 130/80 (BP Location: Right Arm, Patient Position: Sitting, Cuff Size: Normal)   Pulse 84   Resp 14   Ht 5\' 1"  (1.549 m)   Wt 135 lb (61.2 kg)   LMP 03/30/1989 (Approximate)   BMI 25.51 kg/m   Weight change: @WEIGHTCHANGE @ Height:   Height: 5\' 1"  (154.9 cm)  Ht Readings from Last 3 Encounters:  05/12/17 5\' 1"  (1.549 m)  05/11/16 5' 1.5" (1.562 m)  04/23/15 5' 1.5" (1.562 m)    General appearance: alert, cooperative and appears stated age Head: Normocephalic, without obvious abnormality, atraumatic Neck: no adenopathy, supple, symmetrical, trachea midline and thyroid normal to inspection and palpation Lungs: clear to auscultation bilaterally Cardiovascular: regular rate and rhythm Breasts: normal appearance, no masses or tenderness Abdomen: soft, non-tender; non distended,  no masses,  no organomegaly Extremities: extremities normal, atraumatic, no cyanosis or edema Skin: Skin color, texture, turgor normal. No rashes  or lesions Lymph nodes: Cervical, supraclavicular, and axillary nodes normal. No abnormal inguinal nodes palpated Neurologic: Grossly normal   Pelvic: External genitalia:  no lesions              Urethra:  normal appearing urethra with no masses, tenderness or lesions              Bartholins and Skenes: normal                 Vagina: normal appearing vagina with normal color and discharge, no lesions              Cervix: absent               Bimanual Exam:  Uterus:  not examined              Adnexa: no mass, fullness, tenderness               Rectovaginal: Confirms                Anus:  normal sphincter tone, no lesions  Chaperone was present for exam.  A:  Well Woman with normal exam  On HSV suppression, no outbreaks x 2 years  Vasomotor symptoms  P:   Screening labs  No paps needed  She will take 1/2 of her 1000 mg of valtrex a day and then maybe try going off.  Discussed behavioral changes for vasomotor symptoms  Try estroven and estroven pm, if not helping consider estrogen or gabapentin.   Mammogram in 4/19  DEXA overdue, ordered  Colonoscopy  Discussed breast self exam  Discussed calcium and vit D intake

## 2017-05-12 NOTE — Patient Instructions (Addendum)
For hot flashes you can try Estroven and or Estroven pm (for sleep)  EXERCISE AND DIET:  We recommended that you start or continue a regular exercise program for good health. Regular exercise means any activity that makes your heart beat faster and makes you sweat.  We recommend exercising at least 30 minutes per day at least 3 days a week, preferably 4 or 5.  We also recommend a diet low in fat and sugar.  Inactivity, poor dietary choices and obesity can cause diabetes, heart attack, stroke, and kidney damage, among others.    ALCOHOL AND SMOKING:  Women should limit their alcohol intake to no more than 7 drinks/beers/glasses of wine (combined, not each!) per week. Moderation of alcohol intake to this level decreases your risk of breast cancer and liver damage. And of course, no recreational drugs are part of a healthy lifestyle.  And absolutely no smoking or even second hand smoke. Most people know smoking can cause heart and lung diseases, but did you know it also contributes to weakening of your bones? Aging of your skin?  Yellowing of your teeth and nails?  CALCIUM AND VITAMIN D:  Adequate intake of calcium and Vitamin D are recommended.  The recommendations for exact amounts of these supplements seem to change often, but generally speaking 600 mg of calcium (either carbonate or citrate) and 800 units of Vitamin D per day seems prudent. Certain women may benefit from higher intake of Vitamin D.  If you are among these women, your doctor will have told you during your visit.    PAP SMEARS:  Pap smears, to check for cervical cancer or precancers,  have traditionally been done yearly, although recent scientific advances have shown that most women can have pap smears less often.  However, every woman still should have a physical exam from her gynecologist every year. It will include a breast check, inspection of the vulva and vagina to check for abnormal growths or skin changes, a visual exam of the cervix,  and then an exam to evaluate the size and shape of the uterus and ovaries.  And after 71 years of age, a rectal exam is indicated to check for rectal cancers. We will also provide age appropriate advice regarding health maintenance, like when you should have certain vaccines, screening for sexually transmitted diseases, bone density testing, colonoscopy, mammograms, etc.   MAMMOGRAMS:  All women over 464 years old should have a yearly mammogram. Many facilities now offer a "3D" mammogram, which may cost around $50 extra out of pocket. If possible,  we recommend you accept the option to have the 3D mammogram performed.  It both reduces the number of women who will be called back for extra views which then turn out to be normal, and it is better than the routine mammogram at detecting truly abnormal areas.    COLONOSCOPY:  Colonoscopy to screen for colon cancer is recommended for all women at age 71.  We know, you hate the idea of the prep.  We agree, BUT, having colon cancer and not knowing it is worse!!  Colon cancer so often starts as a polyp that can be seen and removed at colonscopy, which can quite literally save your life!  And if your first colonoscopy is normal and you have no family history of colon cancer, most women don't have to have it again for 10 years.  Once every ten years, you can do something that may end up saving your life, right?  We will be happy to help you get it scheduled when you are ready.  Be sure to check your insurance coverage so you understand how much it will cost.  It may be covered as a preventative service at no cost, but you should check your particular policy.

## 2017-05-13 LAB — COMPREHENSIVE METABOLIC PANEL
ALK PHOS: 81 IU/L (ref 39–117)
ALT: 14 IU/L (ref 0–32)
AST: 19 IU/L (ref 0–40)
Albumin/Globulin Ratio: 1.2 (ref 1.2–2.2)
Albumin: 4.1 g/dL (ref 3.5–4.8)
BUN / CREAT RATIO: 15 (ref 12–28)
BUN: 12 mg/dL (ref 8–27)
Bilirubin Total: 0.5 mg/dL (ref 0.0–1.2)
CO2: 24 mmol/L (ref 20–29)
Calcium: 9.9 mg/dL (ref 8.7–10.3)
Chloride: 101 mmol/L (ref 96–106)
Creatinine, Ser: 0.82 mg/dL (ref 0.57–1.00)
GFR calc Af Amer: 83 mL/min/{1.73_m2} (ref >59)
GFR calc non Af Amer: 72 mL/min/{1.73_m2} (ref >59)
GLUCOSE: 87 mg/dL (ref 65–99)
Globulin, Total: 3.4 g/dL (ref 1.5–4.5)
Potassium: 4.6 mmol/L (ref 3.5–5.2)
Sodium: 139 mmol/L (ref 134–144)
Total Protein: 7.5 g/dL (ref 6.0–8.5)

## 2017-05-13 LAB — LIPID PANEL
CHOLESTEROL TOTAL: 227 mg/dL — AB (ref 100–199)
Chol/HDL Ratio: 1.9 ratio (ref 0.0–4.4)
HDL: 120 mg/dL (ref >39)
LDL Calculated: 96 mg/dL (ref 0–99)
TRIGLYCERIDES: 56 mg/dL (ref 0–149)
VLDL CHOLESTEROL CAL: 11 mg/dL (ref 5–40)

## 2017-05-13 LAB — CBC
HEMOGLOBIN: 12.7 g/dL (ref 11.1–15.9)
Hematocrit: 39.2 % (ref 34.0–46.6)
MCH: 28.5 pg (ref 26.6–33.0)
MCHC: 32.4 g/dL (ref 31.5–35.7)
MCV: 88 fL (ref 79–97)
Platelets: 341 10*3/uL (ref 150–379)
RBC: 4.45 x10E6/uL (ref 3.77–5.28)
RDW: 13.4 % (ref 12.3–15.4)
WBC: 8.4 10*3/uL (ref 3.4–10.8)

## 2017-05-13 LAB — VITAMIN D 25 HYDROXY (VIT D DEFICIENCY, FRACTURES): Vit D, 25-Hydroxy: 43.4 ng/mL (ref 30.0–100.0)

## 2017-05-27 ENCOUNTER — Other Ambulatory Visit: Payer: Self-pay | Admitting: Obstetrics and Gynecology

## 2017-05-27 DIAGNOSIS — M8580 Other specified disorders of bone density and structure, unspecified site: Secondary | ICD-10-CM

## 2017-05-27 DIAGNOSIS — Z1231 Encounter for screening mammogram for malignant neoplasm of breast: Secondary | ICD-10-CM

## 2017-07-01 ENCOUNTER — Other Ambulatory Visit: Payer: Self-pay | Admitting: *Deleted

## 2017-07-01 MED ORDER — VALACYCLOVIR HCL 1 G PO TABS: 1000.0000 mg | ORAL_TABLET | Freq: Every day | ORAL | 3 refills | Status: DC

## 2017-07-01 NOTE — Telephone Encounter (Signed)
Fax request received from [facility] [address] for the following:   Medication refill request: Valtrex Last AEX:  05/12/17 JJ  Next AEX: 06/02/18 Last MMG (if hormonal medication request): 07/09/16 BIRADS 1 negative  Refill authorized: 07/10/16 #90, 3RF. Today, please advise.   Spoke with patient in regards to refill request, and she states she does need medication refilled at this time. RN asked how she is taking medication and patient states she has cut back to 1/2 tablet daily as recommended by Dr. Oscar LaJertson at her aex in 2/19. Pharmacy confirmed.

## 2017-07-12 ENCOUNTER — Ambulatory Visit
Admission: RE | Admit: 2017-07-12 | Discharge: 2017-07-12 | Disposition: A | Discharge status: Home / Self Care | Payer: Federal, State, Local not specified - PPO | Source: Ambulatory Visit | Attending: Obstetrics and Gynecology | Admitting: Obstetrics and Gynecology

## 2017-07-12 DIAGNOSIS — M8580 Other specified disorders of bone density and structure, unspecified site: Secondary | ICD-10-CM

## 2017-07-12 DIAGNOSIS — Z1231 Encounter for screening mammogram for malignant neoplasm of breast: Secondary | ICD-10-CM

## 2017-11-04 DIAGNOSIS — H43393 Other vitreous opacities, bilateral: Secondary | ICD-10-CM | POA: Diagnosis not present

## 2017-12-26 DIAGNOSIS — Z23 Encounter for immunization: Secondary | ICD-10-CM | POA: Diagnosis not present

## 2018-05-05 ENCOUNTER — Other Ambulatory Visit: Payer: Self-pay

## 2018-05-05 ENCOUNTER — Encounter (HOSPITAL_COMMUNITY): Payer: Self-pay | Admitting: Emergency Medicine

## 2018-05-05 ENCOUNTER — Ambulatory Visit (HOSPITAL_COMMUNITY)
Admission: EM | Admit: 2018-05-05 | Discharge: 2018-05-05 | Disposition: A | Discharge status: Home / Self Care | Payer: Medicare Other | Attending: Family Medicine | Admitting: Family Medicine

## 2018-05-05 ENCOUNTER — Telehealth: Payer: Self-pay | Admitting: Obstetrics and Gynecology

## 2018-05-05 DIAGNOSIS — L259 Unspecified contact dermatitis, unspecified cause: Secondary | ICD-10-CM | POA: Diagnosis not present

## 2018-05-05 MED ORDER — PREDNISONE 10 MG (21) PO TBPK: ORAL_TABLET | Freq: Every day | ORAL | 0 refills | Status: DC

## 2018-05-05 NOTE — ED Triage Notes (Signed)
Rash involving entire face , chest and back.  No pain, no fever.  Rash itches.  Rash started on friday

## 2018-05-05 NOTE — Telephone Encounter (Signed)
Spoke with patient.  She c/o two days of herpes outbreak. States "this is the worst one I've ever had and its more spread out than usual."  Has been under a lot of stress.   Does not have PCP.  Advised needs to go to urgent care/ED or see doctor if concerns about eye.  Discussed possible of shingles if on face and parts of body.   Pt will go to urgent care now. Encounter to Dr. Oscar La and will close.

## 2018-05-05 NOTE — ED Provider Notes (Signed)
[facility]   [id number] 05/05/18 Arrival Time: 1009  ASSESSMENT & PLAN:  1. Contact dermatitis, unspecified contact dermatitis type, unspecified trigger     Meds ordered this encounter  Medications  . predniSONE (STERAPRED UNI-PAK 21 TAB) 10 MG (21) TBPK tablet    Sig: Take by mouth daily. Take as directed.    Dispense:  21 tablet    Refill:  0   Will f/u here if not seeing improvement over the next 48 hours, sooner if needed.  Reviewed expectations re: course of current medical issues. Questions answered. Outlined signs and symptoms indicating need for more acute intervention. Patient verbalized understanding. After Visit Summary given.   SUBJECTIVE:  Brandy Brown is a 73 y.o. female who presents with a skin complaint.   Location: face, neck, torso, extremities Onset: abrupt Duration: 4-5 days ago Associated pruritis? "intense" Associated pain? none Progression: increasing steadily  Drainage? No  Known trigger? No  New soaps/lotions/topicals/detergents/environmental exposures? No Contacts with similar? No Recent travel? No  Other associated symptoms: none Therapies tried thus far: none Arthralgia or myalgia? none Recent illness? none Fever? none No specific aggravating or alleviating factors reported. No respiratory or swallowing difficulties.  ROS: As per HPI.  OBJECTIVE: Vitals:   05/05/18 1113  BP: (!) 187/90  Pulse: 91  Resp: 18  Temp: 97.8 F (36.6 C)  TempSrc: Temporal  SpO2: 100%    General appearance: alert; no distress Lungs: clear to auscultation bilaterally Heart: regular rate and rhythm Extremities: no edema Skin: warm and dry; diffuse erythematous maculo-papular rash over her face, neck, torso, and extremities consistent with a contact dermatitis; no signs of infection Psychological: alert and cooperative; normal mood and affect  Allergies  Allergen Reactions  . Sulfa Drugs Cross Reactors Itching and Swelling    Past  Medical History:  Diagnosis Date  . Allergy   . Arthritis   . STD (sexually transmitted disease)    HSV  . Vitamin D deficiency    Social History   Socioeconomic History  . Marital status: Divorced    Spouse name: Not on file  . Number of children: 2  . Years of education: Not on file  . Highest education level: Not on file  Occupational History  . Not on file  Social Needs  . Financial resource strain: Not on file  . Food insecurity:    Worry: Not on file    Inability: Not on file  . Transportation needs:    Medical: Not on file    Non-medical: Not on file  Tobacco Use  . Smoking status: Never Smoker  . Smokeless tobacco: Never Used  Substance and Sexual Activity  . Alcohol use: No    Alcohol/week: 0.0 standard drinks  . Drug use: No  . Sexual activity: Never    Partners: Male    Birth control/protection: Post-menopausal  Lifestyle  . Physical activity:    Days per week: Not on file    Minutes per session: Not on file  . Stress: Not on file  Relationships  . Social connections:    Talks on phone: Not on file    Gets together: Not on file    Attends religious service: Not on file    Active member of club or organization: Not on file    Attends meetings of clubs or organizations: Not on file    Relationship status: Not on file  . Intimate partner violence:    Fear of current or ex partner: Not  on file    Emotionally abused: Not on file    Physically abused: Not on file    Forced sexual activity: Not on file  Other Topics Concern  . Not on file  Social History Narrative  . Not on file   Family History  Problem Relation Age of Onset  . Diabetes Mother   . Kidney disease Mother        only has one kidney  . Heart attack Father   . Diabetes Brother    Past Surgical History:  Procedure Laterality Date  . ABDOMINAL HYSTERECTOMY  1991   Fibroids, AUB  . CHOLECYSTECTOMY  1990's  . COLONOSCOPY WITH PROPOFOL N/A 10/18/2014   Procedure: COLONOSCOPY WITH  PROPOFOL;  Surgeon: Willis Modena, MD;  Location: [facility];  Service: Endoscopy;  Laterality: N/A;  ultraslim scope   . TONSILLECTOMY  age 22     Mardella Layman, MD 05/05/18 1134

## 2018-05-05 NOTE — Telephone Encounter (Signed)
Patient said "I have the herpes virus and was told to see my doctor if I have an outbreak near my eye". Patient would like to see Dr.Jertson.

## 2018-05-13 ENCOUNTER — Ambulatory Visit (HOSPITAL_COMMUNITY)
Admission: EM | Admit: 2018-05-13 | Discharge: 2018-05-13 | Disposition: A | Discharge status: Home / Self Care | Payer: Federal, State, Local not specified - PPO | Attending: Family Medicine | Admitting: Family Medicine

## 2018-05-13 ENCOUNTER — Encounter (HOSPITAL_COMMUNITY): Payer: Self-pay | Admitting: Emergency Medicine

## 2018-05-13 DIAGNOSIS — R21 Rash and other nonspecific skin eruption: Secondary | ICD-10-CM | POA: Diagnosis not present

## 2018-05-13 MED ORDER — PREDNISONE 10 MG (21) PO TBPK: ORAL_TABLET | ORAL | 0 refills | Status: DC

## 2018-05-13 MED ORDER — TRIAMCINOLONE ACETONIDE 0.1 % EX CREA: 1.0000 "application " | TOPICAL_CREAM | Freq: Two times a day (BID) | CUTANEOUS | 0 refills | Status: DC

## 2018-05-13 NOTE — Discharge Instructions (Addendum)
Please return or see your primary care doctor if rash persists

## 2018-05-13 NOTE — ED Triage Notes (Signed)
Pt c/o rash all over face and chest and was seen here on 2/6 and given prednisone and states it went away for a little bit but came back and is requesting more prednisone.

## 2018-05-13 NOTE — ED Provider Notes (Signed)
[facility]    CSN: [id number] Arrival date & time: 05/13/18  0815     History   Chief Complaint Chief Complaint  Patient presents with  . Rash  . Medication Refill    HPI Brandy Brown is a 73 y.o. female.   Pt c/o rash all over face and chest and was seen here on 2/6 and given prednisone and states it went away for a little bit but came back and is requesting more prednisone.      Past Medical History:  Diagnosis Date  . Allergy   . Arthritis   . STD (sexually transmitted disease)    HSV  . Vitamin D deficiency     There are no active problems to display for this patient.   Past Surgical History:  Procedure Laterality Date  . ABDOMINAL HYSTERECTOMY  1991   Fibroids, AUB  . CHOLECYSTECTOMY  1990's  . COLONOSCOPY WITH PROPOFOL N/A 10/18/2014   Procedure: COLONOSCOPY WITH PROPOFOL;  Surgeon: Willis Modena, MD;  Location: [facility];  Service: Endoscopy;  Laterality: N/A;  ultraslim scope   . TONSILLECTOMY  age 37    OB History    Gravida  2   Para  2   Term  2   Preterm  0   AB  0   Living  2     SAB  0   TAB  0   Ectopic  0   Multiple  0   Live Births  2            Home Medications    Prior to Admission medications   Medication Sig Start Date End Date Taking? Authorizing Provider  Multiple Vitamin (MULTIVITAMIN) tablet Take 1 tablet by mouth daily.    [provider]  predniSONE (STERAPRED UNI-PAK 21 TAB) 10 MG (21) TBPK tablet Take as directed. 3 daily for 3 days, then 2 daily for 3 days, then one daily 05/13/18   Elvina Sidle, MD  triamcinolone cream (KENALOG) 0.1 % Apply 1 application topically 2 (two) times daily. 05/13/18   Elvina Sidle, MD  valACYclovir (VALTREX) 1000 MG tablet Take 1 tablet (1,000 mg total) by mouth daily. 07/01/17   Romualdo Bolk, MD    Family History Family History  Problem Relation Age of Onset  . Diabetes Mother   . Kidney disease Mother        only has one kidney    . Heart attack Father   . Diabetes Brother     Social History Social History   Tobacco Use  . Smoking status: Never Smoker  . Smokeless tobacco: Never Used  Substance Use Topics  . Alcohol use: No    Alcohol/week: 0.0 standard drinks  . Drug use: No     Allergies   Sulfa drugs cross reactors   Review of Systems Review of Systems   Physical Exam Triage Vital Signs ED Triage Vitals [05/13/18 0921]  Enc Vitals Group     BP (!) 163/79     Pulse Rate 88     Resp 18     Temp 98.1 F (36.7 C)     Temp src      SpO2 100 %     Weight      Height      Head Circumference      Peak Flow      Pain Score 0     Pain Loc      Pain Edu?  Excl. in GC?    No data found.  Updated Vital Signs BP (!) 163/79   Pulse 88   Temp 98.1 F (36.7 C)   Resp 18   LMP 03/30/1989 (Approximate)   SpO2 100%    Physical Exam Vitals signs and nursing note reviewed.  Constitutional:      Appearance: Normal appearance. She is normal weight.  Musculoskeletal: Normal range of motion.  Skin:    General: Skin is warm and dry.     Findings: Erythema and rash present.     Comments: Confluent rash over the upper anterior chest, neck, chin, nasolabial folds.  She also has confluent erythematous rash in the lumbar region midline  Neurological:     General: No focal deficit present.     Mental Status: She is alert.  Psychiatric:        Mood and Affect: Mood normal.      UC Treatments / Results  Labs (all labs ordered are listed, but only abnormal results are displayed) Labs Reviewed - No data to display  EKG None  Radiology No results found.  Procedures Procedures (including critical care time)  Medications Ordered in UC Medications - No data to display  Initial Impression / Assessment and Plan / UC Course  I have reviewed the triage vital signs and the nursing notes.  Pertinent labs & imaging results that were available during my care of the patient were reviewed  by me and considered in my medical decision making (see chart for details).    Final Clinical Impressions(s) / UC Diagnoses   Final diagnoses:  Rash     Discharge Instructions     Please return or see your primary care doctor if rash persists    ED Prescriptions    Medication Sig Dispense Auth. Provider   predniSONE (STERAPRED UNI-PAK 21 TAB) 10 MG (21) TBPK tablet Take as directed. 3 daily for 3 days, then 2 daily for 3 days, then one daily 21 tablet Henreitta Spittler, Kenyon Ana, MD   triamcinolone cream (KENALOG) 0.1 % Apply 1 application topically 2 (two) times daily. 453 g Elvina Sidle, MD     Controlled Substance Prescriptions [place] Controlled Substance Registry consulted? Not Applicable   Elvina Sidle, MD 05/13/18 1014

## 2018-05-31 NOTE — Progress Notes (Signed)
73 y.o. G40P2002 Divorced Black or [place] American Not Hispanic or Latino female here for annual exam.  H/O hysterectomy, not sexually active.     Mom died recently. She is under stress getting the house sold. She had her first HSV outbreak recently in a long time.  She has 3 sisters, but they aren't helping.   Patient's last menstrual period was 03/30/1989 (approximate).          Sexually active: No.  The current method of family planning is post hysterectomy. Exercising: No.  The patient does not participate in regular exercise at present. Smoker:  no  Health Maintenance: Pap:  Unsure History of abnormal Pap:  no MMG:  07/12/2017 Birads 1 negative Colonoscopy:  2016 normal per patient  BMD:   07/12/2017 Osteopenia, minimal. FRAX 4/0.4%. F/U DEXA in 10 years.  TDaP:  03-26-10 Gardasil: N/A   reports that she has never smoked. She has never used smokeless tobacco. She reports that she does not drink alcohol or use drugs. 2 grown children. One grand son, 54, local  Past Medical History:  Diagnosis Date  . Allergy   . Arthritis   . STD (sexually transmitted disease)    HSV  . Vitamin D deficiency     Past Surgical History:  Procedure Laterality Date  . ABDOMINAL HYSTERECTOMY  1991   Fibroids, AUB  . CHOLECYSTECTOMY  1990's  . COLONOSCOPY WITH PROPOFOL N/A 10/18/2014   Procedure: COLONOSCOPY WITH PROPOFOL;  Surgeon: Willis Modena, MD;  Location: [facility];  Service: Endoscopy;  Laterality: N/A;  ultraslim scope   . TONSILLECTOMY  age 5    Current Outpatient Medications  Medication Sig Dispense Refill  . Multiple Vitamin (MULTIVITAMIN) tablet Take 1 tablet by mouth daily.    . valACYclovir (VALTREX) 1000 MG tablet Take 1 tablet (1,000 mg total) by mouth daily. 90 tablet 3   No current facility-administered medications for this visit.     Family History  Problem Relation Age of Onset  . Diabetes Mother   . Kidney disease Mother        only has one kidney  . Heart  attack Father   . Diabetes Brother     Review of Systems  Constitutional: Negative.   HENT: Negative.   Eyes: Negative.   Respiratory: Negative.   Cardiovascular: Negative.   Gastrointestinal: Negative.   Endocrine: Negative.   Genitourinary: Negative.   Musculoskeletal: Negative.   Skin: Positive for rash.  Allergic/Immunologic: Negative.   Neurological: Negative.   Hematological: Negative.   Psychiatric/Behavioral: Negative.     Exam:   BP (!) 150/82 (BP Location: Right Arm, Patient Position: Sitting, Cuff Size: Normal)   Pulse 88   Ht 5\' 3"  (1.6 m)   Wt 145 lb 9.6 oz (66 kg)   LMP 03/30/1989 (Approximate)   BMI 25.79 kg/m   Weight change: @WEIGHTCHANGE @ Height:   Height: 5\' 3"  (160 cm)  Ht Readings from Last 3 Encounters:  06/02/18 5\' 3"  (1.6 m)  05/12/17 5\' 1"  (1.549 m)  05/11/16 5' 1.5" (1.562 m)    General appearance: alert, cooperative and appears stated age Head: Normocephalic, without obvious abnormality, atraumatic Neck: no adenopathy, supple, symmetrical, trachea midline and thyroid normal to inspection and palpation Lungs: clear to auscultation bilaterally Cardiovascular: regular rate and rhythm Breasts: normal appearance, no masses or tenderness Abdomen: soft, non-tender; non distended,  no masses,  no organomegaly Extremities: extremities normal, atraumatic, no cyanosis or edema Skin: Skin color, texture, turgor normal. No rashes or  lesions Lymph nodes: Cervical, supraclavicular, and axillary nodes normal. No abnormal inguinal nodes palpated Neurologic: Grossly normal   Pelvic: External genitalia:  no lesions              Urethra:  normal appearing urethra with no masses, tenderness or lesions              Bartholins and Skenes: normal                 Vagina: normal appearing vagina with normal color and discharge, no lesions              Cervix: absent               Bimanual Exam:  Uterus:  uterus absent              Adnexa: no mass, fullness,  tenderness               Rectovaginal: Confirms               Anus:  normal sphincter tone, no lesions  Chaperone was present for exam.  A:  Well Woman with normal exam  Elevated BP without h/o HTN (under stress). Repeat BP 142/86  H/O HSV, infrequent outbreaks. Wants to continue suppression.  FH of DM  P:   No pap needed  Screening labs (not fasting, no CBC or HgbA1C not covered)  Mammogram  Colonoscopy UTD  DEXA UTD  Continue valtrex  She will return for a BP check next month after her stress lessens

## 2018-06-02 ENCOUNTER — Ambulatory Visit (INDEPENDENT_AMBULATORY_CARE_PROVIDER_SITE_OTHER): Payer: Medicare Other | Admitting: Obstetrics and Gynecology

## 2018-06-02 ENCOUNTER — Other Ambulatory Visit: Payer: Self-pay

## 2018-06-02 ENCOUNTER — Encounter: Payer: Self-pay | Admitting: Obstetrics and Gynecology

## 2018-06-02 VITALS — BP 150/82 | HR 88 | Ht 63.0 in | Wt 145.6 lb

## 2018-06-02 DIAGNOSIS — Z833 Family history of diabetes mellitus: Secondary | ICD-10-CM | POA: Diagnosis not present

## 2018-06-02 DIAGNOSIS — Z124 Encounter for screening for malignant neoplasm of cervix: Secondary | ICD-10-CM

## 2018-06-02 DIAGNOSIS — Z01419 Encounter for gynecological examination (general) (routine) without abnormal findings: Secondary | ICD-10-CM

## 2018-06-02 DIAGNOSIS — Z0000 Encounter for general adult medical examination without abnormal findings: Secondary | ICD-10-CM

## 2018-06-02 MED ORDER — VALACYCLOVIR HCL 500 MG PO TABS: ORAL_TABLET | ORAL | 4 refills | Status: DC

## 2018-06-02 NOTE — Patient Instructions (Signed)
EXERCISE AND DIET:  We recommended that you start or continue a regular exercise program for good health. Regular exercise means any activity that makes your heart beat faster and makes you sweat.  We recommend exercising at least 30 minutes per day at least 3 days a week, preferably 4 or 5.  We also recommend a diet low in fat and sugar.  Inactivity, poor dietary choices and obesity can cause diabetes, heart attack, stroke, and kidney damage, among others.    ALCOHOL AND SMOKING:  Women should limit their alcohol intake to no more than 7 drinks/beers/glasses of wine (combined, not each!) per week. Moderation of alcohol intake to this level decreases your risk of breast cancer and liver damage. And of course, no recreational drugs are part of a healthy lifestyle.  And absolutely no smoking or even second hand smoke. Most people know smoking can cause heart and lung diseases, but did you know it also contributes to weakening of your bones? Aging of your skin?  Yellowing of your teeth and nails?  CALCIUM AND VITAMIN D:  Adequate intake of calcium and Vitamin D are recommended.  The recommendations for exact amounts of these supplements seem to change often, but generally speaking 1,200 mg of calcium (between diet and supplement) and 800 units of Vitamin D per day seems prudent. Certain women may benefit from higher intake of Vitamin D.  If you are among these women, your doctor will have told you during your visit.    PAP SMEARS:  Pap smears, to check for cervical cancer or precancers,  have traditionally been done yearly, although recent scientific advances have shown that most women can have pap smears less often.  However, every woman still should have a physical exam from her gynecologist every year. It will include a breast check, inspection of the vulva and vagina to check for abnormal growths or skin changes, a visual exam of the cervix, and then an exam to evaluate the size and shape of the uterus and  ovaries.  And after 72 years of age, a rectal exam is indicated to check for rectal cancers. We will also provide age appropriate advice regarding health maintenance, like when you should have certain vaccines, screening for sexually transmitted diseases, bone density testing, colonoscopy, mammograms, etc.   MAMMOGRAMS:  All women over 40 years old should have a yearly mammogram. Many facilities now offer a "3D" mammogram, which may cost around $50 extra out of pocket. If possible,  we recommend you accept the option to have the 3D mammogram performed.  It both reduces the number of women who will be called back for extra views which then turn out to be normal, and it is better than the routine mammogram at detecting truly abnormal areas.    COLON CANCER SCREENING: Now recommend starting at age 45. At this time colonoscopy is not covered for routine screening until 50. There are take home tests that can be done between 45-49.   COLONOSCOPY:  Colonoscopy to screen for colon cancer is recommended for all women at age 50.  We know, you hate the idea of the prep.  We agree, BUT, having colon cancer and not knowing it is worse!!  Colon cancer so often starts as a polyp that can be seen and removed at colonscopy, which can quite literally save your life!  And if your first colonoscopy is normal and you have no family history of colon cancer, most women don't have to have it again for   10 years.  Once every ten years, you can do something that may end up saving your life, right?  We will be happy to help you get it scheduled when you are ready.  Be sure to check your insurance coverage so you understand how much it will cost.  It may be covered as a preventative service at no cost, but you should check your particular policy.      Breast Self-Awareness Breast self-awareness means being familiar with how your breasts look and feel. It involves checking your breasts regularly and reporting any changes to your  health care provider. Practicing breast self-awareness is important. A change in your breasts can be a sign of a serious medical problem. Being familiar with how your breasts look and feel allows you to find any problems early, when treatment is more likely to be successful. All women should practice breast self-awareness, including women who have had breast implants. How to do a breast self-exam One way to learn what is normal for your breasts and whether your breasts are changing is to do a breast self-exam. To do a breast self-exam: Look for Changes  1. Remove all the clothing above your waist. 2. Stand in front of a mirror in a room with good lighting. 3. Put your hands on your hips. 4. Push your hands firmly downward. 5. Compare your breasts in the mirror. Look for differences between them (asymmetry), such as: ? Differences in shape. ? Differences in size. ? Puckers, dips, and bumps in one breast and not the other. 6. Look at each breast for changes in your skin, such as: ? Redness. ? Scaly areas. 7. Look for changes in your nipples, such as: ? Discharge. ? Bleeding. ? Dimpling. ? Redness. ? A change in position. Feel for Changes Carefully feel your breasts for lumps and changes. It is best to do this while lying on your back on the floor and again while sitting or standing in the shower or tub with soapy water on your skin. Feel each breast in the following way:  Place the arm on the side of the breast you are examining above your head.  Feel your breast with the other hand.  Start in the nipple area and make  inch (2 cm) overlapping circles to feel your breast. Use the pads of your three middle fingers to do this. Apply light pressure, then medium pressure, then firm pressure. The light pressure will allow you to feel the tissue closest to the skin. The medium pressure will allow you to feel the tissue that is a little deeper. The firm pressure will allow you to feel the tissue  close to the ribs.  Continue the overlapping circles, moving downward over the breast until you feel your ribs below your breast.  Move one finger-width toward the center of the body. Continue to use the  inch (2 cm) overlapping circles to feel your breast as you move slowly up toward your collarbone.  Continue the up and down exam using all three pressures until you reach your armpit.  Write Down What You Find  Write down what is normal for each breast and any changes that you find. Keep a written record with breast changes or normal findings for each breast. By writing this information down, you do not need to depend only on memory for size, tenderness, or location. Write down where you are in your menstrual cycle, if you are still menstruating. If you are having trouble noticing differences   in your breasts, do not get discouraged. With time you will become more familiar with the variations in your breasts and more comfortable with the exam. How often should I examine my breasts? Examine your breasts every month. If you are breastfeeding, the best time to examine your breasts is after a feeding or after using a breast pump. If you menstruate, the best time to examine your breasts is 5-7 days after your period is over. During your period, your breasts are lumpier, and it may be more difficult to notice changes. When should I see my health care provider? See your health care provider if you notice:  A change in shape or size of your breasts or nipples.  A change in the skin of your breast or nipples, such as a reddened or scaly area.  Unusual discharge from your nipples.  A lump or thick area that was not there before.  Pain in your breasts.  Anything that concerns you.  

## 2018-06-03 LAB — COMPREHENSIVE METABOLIC PANEL
ALT: 16 IU/L (ref 0–32)
AST: 17 IU/L (ref 0–40)
Albumin/Globulin Ratio: 1.5 (ref 1.2–2.2)
Albumin: 4 g/dL (ref 3.7–4.7)
Alkaline Phosphatase: 89 IU/L (ref 39–117)
BUN/Creatinine Ratio: 15 (ref 12–28)
BUN: 11 mg/dL (ref 8–27)
Bilirubin Total: 0.4 mg/dL (ref 0.0–1.2)
CO2: 25 mmol/L (ref 20–29)
Calcium: 9.8 mg/dL (ref 8.7–10.3)
Chloride: 105 mmol/L (ref 96–106)
Creatinine, Ser: 0.71 mg/dL (ref 0.57–1.00)
GFR calc Af Amer: 98 mL/min/{1.73_m2} (ref >59)
GFR calc non Af Amer: 85 mL/min/{1.73_m2} (ref >59)
Globulin, Total: 2.6 g/dL (ref 1.5–4.5)
Glucose: 127 mg/dL — ABNORMAL HIGH (ref 65–99)
Potassium: 3.9 mmol/L (ref 3.5–5.2)
Sodium: 141 mmol/L (ref 134–144)
Total Protein: 6.6 g/dL (ref 6.0–8.5)

## 2018-06-03 LAB — LIPID PANEL
Chol/HDL Ratio: 2.5 ratio (ref 0.0–4.4)
Cholesterol, Total: 207 mg/dL — ABNORMAL HIGH (ref 100–199)
HDL: 84 mg/dL (ref >39)
LDL Calculated: 101 mg/dL — ABNORMAL HIGH (ref 0–99)
Triglycerides: 110 mg/dL (ref 0–149)
VLDL Cholesterol Cal: 22 mg/dL (ref 5–40)

## 2018-06-29 ENCOUNTER — Telehealth: Payer: Self-pay | Admitting: Obstetrics and Gynecology

## 2018-06-29 NOTE — Telephone Encounter (Signed)
Please check with the patient as to what her BP's have been at home. If it has been under 140 systolic or 90 diastolic then she should just periodically monitor it. If it has been high please give her the # of primary MD to establish care when the covid 19 crisis is over.

## 2018-06-29 NOTE — Telephone Encounter (Signed)
Spoke with patient. Advised of message as seen below from Dr.Jertson. Patient states BP has been in the 130s/80s. Advised if it gets over 140/90 then needs to be seen. As long as it is under this needs to monitor periodically. Patient is agreeable. Encounter closed.

## 2018-06-29 NOTE — Telephone Encounter (Signed)
Call to patient to reschedule appointment. Patient stated that she has been monitoring her blood pressure at home and believes it was high due to extra stress that day. Patient requested appointment be canceled instead of rescheduled.  Routing to Dr. Oscar La for [facility]

## 2018-07-04 ENCOUNTER — Ambulatory Visit: Payer: Medicare Other | Admitting: Obstetrics and Gynecology

## 2018-07-18 ENCOUNTER — Other Ambulatory Visit: Payer: Self-pay | Admitting: Obstetrics and Gynecology

## 2018-07-19 NOTE — Telephone Encounter (Signed)
Prescription is being filled at the pharmacy, per [facility]. Patient has been notified.

## 2018-07-19 NOTE — Telephone Encounter (Signed)
Patient is calling regarding refill request for Valtrex 500MG  tablet to be sent to [facility] on [address].

## 2018-10-12 ENCOUNTER — Other Ambulatory Visit: Payer: Self-pay

## 2018-10-12 DIAGNOSIS — Z20822 Contact with and (suspected) exposure to covid-19: Secondary | ICD-10-CM

## 2018-10-12 DIAGNOSIS — R6889 Other general symptoms and signs: Secondary | ICD-10-CM | POA: Diagnosis not present

## 2018-10-16 LAB — NOVEL CORONAVIRUS, NAA: SARS-CoV-2, NAA: NOT DETECTED

## 2018-10-21 ENCOUNTER — Telehealth: Payer: Self-pay | Admitting: General Practice

## 2018-10-21 NOTE — Telephone Encounter (Signed)
Pt called 10/21/2018 made aware of negative results, expressed understanding.  °

## 2019-05-20 ENCOUNTER — Ambulatory Visit: Payer: Medicare Other | Attending: Internal Medicine

## 2019-05-20 DIAGNOSIS — Z23 Encounter for immunization: Secondary | ICD-10-CM | POA: Insufficient documentation

## 2019-05-20 NOTE — Progress Notes (Signed)
   Covid-19 Vaccination Clinic  Name:  Brandy Brown    MRN: [id number] DOB: 05-Dec-1945  05/20/2019  [name] was observed post Covid-19 immunization for 15 minutes without incidence. She was provided with Vaccine Information Sheet and instruction to access the V-Safe system.   [name] was instructed to call 911 with any severe reactions post vaccine: Marland Kitchen Difficulty breathing  . Swelling of your face and throat  . A fast heartbeat  . A bad rash all over your body  . Dizziness and weakness    Immunizations Administered    Name Date Dose VIS Date Route   Pfizer COVID-19 Vaccine 05/20/2019  3:41 PM 0.3 mL 03/10/2019 Intramuscular   Manufacturer: [employer], [employer]   Lot: [id number]   NDC: [address]-2

## 2019-06-05 NOTE — Progress Notes (Signed)
74 y.o. G108P2002 Divorced Black or [place] American Not Hispanic or Latino female here for annual exam.  H/o hysterectomy.    H/O HSV, on suppression. Infrequent outbreaks, when she does get them it is miserable. She wants to stay on suppression.   Patient states that she thinks that she has a little tennitis in her left ear. She is planning on establishing care with a primary care.   Patient's last menstrual period was 03/30/1989 (approximate).          Sexually active: No.  The current method of family planning is post menopausal status.    Exercising: No.  The patient does not participate in regular exercise at present. Smoker:  no  Health Maintenance: Pap:  Unsure  History of abnormal Pap:  no MMG:  07/13/17 density B Bi-rads 1 neg  BMD:   07/12/17 Osteopenia, Minimal, t score -1.1. Frax 4/0.4%, f/u dexa in 10 years. Colonoscopy: 2016 normal per patient  TDaP:  03/26/10 Gardasil: NA   reports that she has never smoked. She has never used smokeless tobacco. She reports that she does not drink alcohol or use drugs.  2 grown children. One grand son, 49, local  Past Medical History:  Diagnosis Date  . Allergy   . Arthritis   . STD (sexually transmitted disease)    HSV  . Vitamin D deficiency     Past Surgical History:  Procedure Laterality Date  . ABDOMINAL HYSTERECTOMY  1991   Fibroids, AUB  . CHOLECYSTECTOMY  1990's  . COLONOSCOPY WITH PROPOFOL N/A 10/18/2014   Procedure: COLONOSCOPY WITH PROPOFOL;  Surgeon: Willis Modena, MD;  Location: [facility];  Service: Endoscopy;  Laterality: N/A;  ultraslim scope   . TONSILLECTOMY  age 43    Current Outpatient Medications  Medication Sig Dispense Refill  . Multiple Vitamin (MULTIVITAMIN) tablet Take 1 tablet by mouth daily.    . valACYclovir (VALTREX) 500 MG tablet 1 tab po qd, increase to one tab BID with an outbreak 90 tablet 4   No current facility-administered medications for this visit.    Family History  Problem  Relation Age of Onset  . Diabetes Mother   . Kidney disease Mother        only has one kidney  . Heart attack Father   . Diabetes Brother     Review of Systems  HENT: Positive for tinnitus.   All other systems reviewed and are negative.   Exam:   BP 140/78   Pulse 84   Temp 97.7 F (36.5 C)   Ht 5' 1.5" (1.562 m)   Wt 144 lb (65.3 kg)   LMP 03/30/1989 (Approximate)   SpO2 95%   BMI 26.77 kg/m   Weight change: @WEIGHTCHANGE @ Height:   Height: 5' 1.5" (156.2 cm)  Ht Readings from Last 3 Encounters:  06/07/19 5' 1.5" (1.562 m)  06/02/18 5\' 3"  (1.6 m)  05/12/17 5\' 1"  (1.549 m)    General appearance: alert, cooperative and appears stated age Head: Normocephalic, without obvious abnormality, atraumatic Neck: no adenopathy, supple, symmetrical, trachea midline and thyroid normal to inspection and palpation Lungs: clear to auscultation bilaterally Cardiovascular: regular rate and rhythm Breasts: normal appearance, no masses or tenderness Abdomen: soft, non-tender; non distended,  no masses,  no organomegaly Extremities: extremities normal, atraumatic, no cyanosis or edema Skin: Skin color, texture, turgor normal. No rashes or lesions Lymph nodes: Cervical, supraclavicular, and axillary nodes normal. No abnormal inguinal nodes palpated Neurologic: Grossly normal   Pelvic: External genitalia:  no lesions              Urethra:  normal appearing urethra with no masses, tenderness or lesions              Bartholins and Skenes: normal                 Vagina: mildly atrophic appearing vagina with normal color and discharge, no lesions              Cervix: absent               Bimanual Exam:  Uterus:  uterus absent              Adnexa: no mass, fullness, tenderness               Rectovaginal: Confirms               Anus:  normal sphincter tone, no lesions  Karmen Bongo chaperoned for the exam.  A:  Well Woman with normal exam  Elevated BP without diagnosis of HTN  FH of  DM    P:   No pap needed  Mammogram overdue, she will schedule  Discussed breast self exam  Discussed calcium and vit D intake  She will establish care with primary MD, labs and immunizations there.

## 2019-06-07 ENCOUNTER — Encounter: Payer: Self-pay | Admitting: Obstetrics and Gynecology

## 2019-06-07 ENCOUNTER — Other Ambulatory Visit: Payer: Self-pay

## 2019-06-07 ENCOUNTER — Ambulatory Visit (INDEPENDENT_AMBULATORY_CARE_PROVIDER_SITE_OTHER): Payer: Medicare Other | Admitting: Obstetrics and Gynecology

## 2019-06-07 VITALS — BP 140/78 | HR 84 | Temp 97.7°F | Ht 61.5 in | Wt 144.0 lb

## 2019-06-07 DIAGNOSIS — R030 Elevated blood-pressure reading, without diagnosis of hypertension: Secondary | ICD-10-CM

## 2019-06-07 DIAGNOSIS — Z01419 Encounter for gynecological examination (general) (routine) without abnormal findings: Secondary | ICD-10-CM | POA: Diagnosis not present

## 2019-06-07 DIAGNOSIS — Z124 Encounter for screening for malignant neoplasm of cervix: Secondary | ICD-10-CM

## 2019-06-07 DIAGNOSIS — Z833 Family history of diabetes mellitus: Secondary | ICD-10-CM

## 2019-06-07 MED ORDER — VALACYCLOVIR HCL 500 MG PO TABS: ORAL_TABLET | ORAL | 4 refills | Status: DC

## 2019-06-07 NOTE — Patient Instructions (Signed)
EXERCISE AND DIET:  We recommended that you start or continue a regular exercise program for good health. Regular exercise means any activity that makes your heart beat faster and makes you sweat.  We recommend exercising at least 30 minutes per day at least 3 days a week, preferably 4 or 5.  We also recommend a diet low in fat and sugar.  Inactivity, poor dietary choices and obesity can cause diabetes, heart attack, stroke, and kidney damage, among others.    ALCOHOL AND SMOKING:  Women should limit their alcohol intake to no more than 7 drinks/beers/glasses of wine (combined, not each!) per week. Moderation of alcohol intake to this level decreases your risk of breast cancer and liver damage. And of course, no recreational drugs are part of a healthy lifestyle.  And absolutely no smoking or even second hand smoke. Most people know smoking can cause heart and lung diseases, but did you know it also contributes to weakening of your bones? Aging of your skin?  Yellowing of your teeth and nails?  CALCIUM AND VITAMIN D:  Adequate intake of calcium and Vitamin D are recommended.  The recommendations for exact amounts of these supplements seem to change often, but generally speaking 1,200 mg of calcium (between diet and supplement) and 800 units of Vitamin D per day seems prudent. Certain women may benefit from higher intake of Vitamin D.  If you are among these women, your doctor will have told you during your visit.    PAP SMEARS:  Pap smears, to check for cervical cancer or precancers,  have traditionally been done yearly, although recent scientific advances have shown that most women can have pap smears less often.  However, every woman still should have a physical exam from her gynecologist every year. It will include a breast check, inspection of the vulva and vagina to check for abnormal growths or skin changes, a visual exam of the cervix, and then an exam to evaluate the size and shape of the uterus and  ovaries.  And after 73 years of age, a rectal exam is indicated to check for rectal cancers. We will also provide age appropriate advice regarding health maintenance, like when you should have certain vaccines, screening for sexually transmitted diseases, bone density testing, colonoscopy, mammograms, etc.   MAMMOGRAMS:  All women over 40 years old should have a yearly mammogram. Many facilities now offer a "3D" mammogram, which may cost around $50 extra out of pocket. If possible,  we recommend you accept the option to have the 3D mammogram performed.  It both reduces the number of women who will be called back for extra views which then turn out to be normal, and it is better than the routine mammogram at detecting truly abnormal areas.    COLON CANCER SCREENING: Now recommend starting at age 45. At this time colonoscopy is not covered for routine screening until 50. There are take home tests that can be done between 45-49.   COLONOSCOPY:  Colonoscopy to screen for colon cancer is recommended for all women at age 50.  We know, you hate the idea of the prep.  We agree, BUT, having colon cancer and not knowing it is worse!!  Colon cancer so often starts as a polyp that can be seen and removed at colonscopy, which can quite literally save your life!  And if your first colonoscopy is normal and you have no family history of colon cancer, most women don't have to have it again for   10 years.  Once every ten years, you can do something that may end up saving your life, right?  We will be happy to help you get it scheduled when you are ready.  Be sure to check your insurance coverage so you understand how much it will cost.  It may be covered as a preventative service at no cost, but you should check your particular policy.      Breast Self-Awareness Breast self-awareness means being familiar with how your breasts look and feel. It involves checking your breasts regularly and reporting any changes to your  health care provider. Practicing breast self-awareness is important. A change in your breasts can be a sign of a serious medical problem. Being familiar with how your breasts look and feel allows you to find any problems early, when treatment is more likely to be successful. All women should practice breast self-awareness, including women who have had breast implants. How to do a breast self-exam One way to learn what is normal for your breasts and whether your breasts are changing is to do a breast self-exam. To do a breast self-exam: Look for Changes  1. Remove all the clothing above your waist. 2. Stand in front of a mirror in a room with good lighting. 3. Put your hands on your hips. 4. Push your hands firmly downward. 5. Compare your breasts in the mirror. Look for differences between them (asymmetry), such as: ? Differences in shape. ? Differences in size. ? Puckers, dips, and bumps in one breast and not the other. 6. Look at each breast for changes in your skin, such as: ? Redness. ? Scaly areas. 7. Look for changes in your nipples, such as: ? Discharge. ? Bleeding. ? Dimpling. ? Redness. ? A change in position. Feel for Changes Carefully feel your breasts for lumps and changes. It is best to do this while lying on your back on the floor and again while sitting or standing in the shower or tub with soapy water on your skin. Feel each breast in the following way:  Place the arm on the side of the breast you are examining above your head.  Feel your breast with the other hand.  Start in the nipple area and make  inch (2 cm) overlapping circles to feel your breast. Use the pads of your three middle fingers to do this. Apply light pressure, then medium pressure, then firm pressure. The light pressure will allow you to feel the tissue closest to the skin. The medium pressure will allow you to feel the tissue that is a little deeper. The firm pressure will allow you to feel the tissue  close to the ribs.  Continue the overlapping circles, moving downward over the breast until you feel your ribs below your breast.  Move one finger-width toward the center of the body. Continue to use the  inch (2 cm) overlapping circles to feel your breast as you move slowly up toward your collarbone.  Continue the up and down exam using all three pressures until you reach your armpit.  Write Down What You Find  Write down what is normal for each breast and any changes that you find. Keep a written record with breast changes or normal findings for each breast. By writing this information down, you do not need to depend only on memory for size, tenderness, or location. Write down where you are in your menstrual cycle, if you are still menstruating. If you are having trouble noticing differences   in your breasts, do not get discouraged. With time you will become more familiar with the variations in your breasts and more comfortable with the exam. How often should I examine my breasts? Examine your breasts every month. If you are breastfeeding, the best time to examine your breasts is after a feeding or after using a breast pump. If you menstruate, the best time to examine your breasts is 5-7 days after your period is over. During your period, your breasts are lumpier, and it may be more difficult to notice changes. When should I see my health care provider? See your health care provider if you notice:  A change in shape or size of your breasts or nipples.  A change in the skin of your breast or nipples, such as a reddened or scaly area.  Unusual discharge from your nipples.  A lump or thick area that was not there before.  Pain in your breasts.  Anything that concerns you.  

## 2019-06-13 ENCOUNTER — Ambulatory Visit: Payer: Medicare Other | Attending: Internal Medicine

## 2019-06-13 DIAGNOSIS — Z23 Encounter for immunization: Secondary | ICD-10-CM

## 2019-06-13 NOTE — Progress Notes (Signed)
   Covid-19 Vaccination Clinic  Name:  Brandy Brown    MRN: [id number] DOB: 1946-03-10  06/13/2019  Brandy Brown was observed post Covid-19 immunization for 15 minutes without incident. She was provided with Vaccine Information Sheet and instruction to access the V-Safe system.   Brandy Brown was instructed to call 911 with any severe reactions post vaccine: Marland Kitchen Difficulty breathing  . Swelling of face and throat  . A fast heartbeat  . A bad rash all over body  . Dizziness and weakness   Immunizations Administered    Name Date Dose VIS Date Route   Pfizer COVID-19 Vaccine 06/13/2019  2:12 PM 0.3 mL 03/10/2019 Intramuscular   Manufacturer: [employer], [employer]   Lot: [id number]   NDC: [address]-1

## 2019-06-26 ENCOUNTER — Other Ambulatory Visit: Payer: Self-pay | Admitting: Obstetrics and Gynecology

## 2019-06-26 DIAGNOSIS — Z1231 Encounter for screening mammogram for malignant neoplasm of breast: Secondary | ICD-10-CM

## 2019-07-26 ENCOUNTER — Ambulatory Visit
Admission: RE | Admit: 2019-07-26 | Discharge: 2019-07-26 | Disposition: A | Discharge status: Home / Self Care | Payer: Medicare Other | Source: Ambulatory Visit | Attending: Obstetrics and Gynecology | Admitting: Obstetrics and Gynecology

## 2019-07-26 ENCOUNTER — Other Ambulatory Visit: Payer: Self-pay

## 2019-07-26 DIAGNOSIS — Z1231 Encounter for screening mammogram for malignant neoplasm of breast: Secondary | ICD-10-CM

## 2020-01-04 DIAGNOSIS — Z23 Encounter for immunization: Secondary | ICD-10-CM | POA: Diagnosis not present

## 2020-02-29 ENCOUNTER — Encounter (HOSPITAL_COMMUNITY): Payer: Self-pay | Admitting: Emergency Medicine

## 2020-02-29 ENCOUNTER — Other Ambulatory Visit: Payer: Self-pay

## 2020-02-29 ENCOUNTER — Ambulatory Visit (HOSPITAL_COMMUNITY)
Admission: EM | Admit: 2020-02-29 | Discharge: 2020-02-29 | Disposition: A | Discharge status: Home / Self Care | Payer: Medicare Other | Attending: Emergency Medicine | Admitting: Emergency Medicine

## 2020-02-29 DIAGNOSIS — R21 Rash and other nonspecific skin eruption: Secondary | ICD-10-CM

## 2020-02-29 MED ORDER — PREDNISONE 10 MG (21) PO TBPK: ORAL_TABLET | Freq: Every day | ORAL | 0 refills | Status: DC

## 2020-02-29 MED ORDER — TRIAMCINOLONE ACETONIDE 0.1 % EX CREA: 1.0000 "application " | TOPICAL_CREAM | Freq: Two times a day (BID) | CUTANEOUS | 0 refills | Status: AC

## 2020-02-29 MED ORDER — PREDNISONE 10 MG (21) PO TBPK: ORAL_TABLET | Freq: Every day | ORAL | 0 refills | Status: AC

## 2020-02-29 NOTE — ED Provider Notes (Signed)
____________________________________________  Time seen: Approximately 2:58 PM  I have reviewed the triage vital signs and the nursing notes.   HISTORY  Chief Complaint Rash   Historian Patient    HPI Brandy Brown is a 74 y.o. female presents to the urgent care with an erythematous, maculopapular rash across chest.  Patient states that she has had similar rash in the past that resolved with tapered steroid and topical triamcinolone.  She denies a prodrome of burning.  No vesicle formation.  Patient denies new contact exposures with new soaps, linens or outdoor foliage.  She denies known tick bites.  No fever or chills.  Patient states that rash is only localized to her chest.  No sick contacts in her home with similar symptoms.  No other alleviating measures have been attempted.   Past Medical History:  Diagnosis Date  . Allergy   . Arthritis   . STD (sexually transmitted disease)    HSV  . Vitamin D deficiency      Immunizations up to date:  Yes.     Past Medical History:  Diagnosis Date  . Allergy   . Arthritis   . STD (sexually transmitted disease)    HSV  . Vitamin D deficiency     There are no problems to display for this patient.   Past Surgical History:  Procedure Laterality Date  . ABDOMINAL HYSTERECTOMY  1991   Fibroids, AUB  . CHOLECYSTECTOMY  1990's  . COLONOSCOPY WITH PROPOFOL N/A 10/18/2014   Procedure: COLONOSCOPY WITH PROPOFOL;  Surgeon: Willis Modena, MD;  Location: [facility];  Service: Endoscopy;  Laterality: N/A;  ultraslim scope   . TONSILLECTOMY  age 70    Prior to Admission medications   Medication Sig Start Date End Date Taking? Authorizing Provider  Multiple Vitamin (MULTIVITAMIN) tablet Take 1 tablet by mouth daily.   Yes [provider]  predniSONE (STERAPRED UNI-PAK 21 TAB) 10 MG (21) TBPK tablet Take by mouth daily for 6 days. Take 6 tablets the first day, take 5 tablets the second day, take 4 tablets the third  day, take 3 tablets the fourth day, take 2 tablets the fifth day, take 1 tablet the sixth day. 02/29/20 03/06/20  Orvil Feil, PA-C  triamcinolone (KENALOG) 0.1 % Apply 1 application topically 2 (two) times daily. 02/29/20   Orvil Feil, PA-C  valACYclovir (VALTREX) 500 MG tablet 1 tab po qd, increase to one tab BID with an outbreak 06/07/19   Romualdo Bolk, MD    Allergies Sulfa drugs cross reactors  Family History  Problem Relation Age of Onset  . Diabetes Mother   . Kidney disease Mother        only has one kidney  . Heart attack Father   . Diabetes Brother     Social History Social History   Tobacco Use  . Smoking status: Never Smoker  . Smokeless tobacco: Never Used  Vaping Use  . Vaping Use: Never used  Substance Use Topics  . Alcohol use: No    Alcohol/week: 0.0 standard drinks  . Drug use: No     Review of Systems  Constitutional: No fever/chills Eyes:  No discharge ENT: No upper respiratory complaints. Respiratory: no cough. No SOB/ use of accessory muscles to breath Gastrointestinal:   No nausea, no vomiting.  No diarrhea.  No constipation. Musculoskeletal: Negative for musculoskeletal pain. Skin: Patient has rash.     ____________________________________________   PHYSICAL EXAM:  VITAL SIGNS: ED Triage  Vitals  Enc Vitals Group     BP 02/29/20 1354 (!) 179/80     Pulse Rate 02/29/20 1354 93     Resp 02/29/20 1354 17     Temp 02/29/20 1354 98.3 F (36.8 C)     Temp Source 02/29/20 1354 Oral     SpO2 02/29/20 1354 100 %     Weight --      Height --      Head Circumference --      Peak Flow --      Pain Score 02/29/20 1352 0     Pain Loc --      Pain Edu? --      Excl. in GC? --      Constitutional: Alert and oriented. Well appearing and in no acute distress. Eyes: Conjunctivae are normal. PERRL. EOMI. Head: Atraumatic. Cardiovascular: Normal rate, regular rhythm. Normal S1 and S2.  Good peripheral circulation. Respiratory:  Normal respiratory effort without tachypnea or retractions. Lungs CTAB. Good air entry to the bases with no decreased or absent breath sounds Gastrointestinal: Bowel sounds x 4 quadrants. Soft and nontender to palpation. No guarding or rigidity. No distention. Musculoskeletal: Full range of motion to all extremities. No obvious deformities noted Neurologic:  Normal for age. No gross focal neurologic deficits are appreciated.  Skin: Patient has erythematous, maculopapular rash across chest.  No surrounding cellulitis.  No vesicle formation. Psychiatric: Mood and affect are normal for age. Speech and behavior are normal.   ____________________________________________   LABS (all labs ordered are listed, but only abnormal results are displayed)  Labs Reviewed - No data to display ____________________________________________  EKG   ____________________________________________  RADIOLOGY  No results found.  ____________________________________________    PROCEDURES  Procedure(s) performed:     Procedures     Medications - No data to display   ____________________________________________   INITIAL IMPRESSION / ASSESSMENT AND PLAN / ED COURSE  Pertinent labs & imaging results that were available during my care of the patient were reviewed by me and considered in my medical decision making (see chart for details).      Assessment and plan Rash 74 year old female presents to the urgent care with an erythematous, maculopapular rash across chest that is non in a dermatomal distribution.  Patient denies a prodrome of burning.  She states that she has had very similar rash in the past that has responded well with taper of prednisone and triamcinolone.  Will repeat treatment for now per patient's request.  Return precautions were given to return with new or worsening symptoms.  All patient questions were answered.    ____________________________________________  FINAL  CLINICAL IMPRESSION(S) / ED DIAGNOSES  Final diagnoses:  Rash      NEW MEDICATIONS STARTED DURING THIS VISIT:  ED Discharge Orders         Ordered    predniSONE (STERAPRED UNI-PAK 21 TAB) 10 MG (21) TBPK tablet  Daily,   Status:  Discontinued        02/29/20 1434    triamcinolone (KENALOG) 0.1 %  2 times daily        02/29/20 1434    predniSONE (STERAPRED UNI-PAK 21 TAB) 10 MG (21) TBPK tablet  Daily        02/29/20 1438              This chart was dictated using voice recognition software/Dragon. Despite best efforts to proofread, errors can occur which can change the meaning. Any change was purely unintentional.  [place], [place], [place] 02/29/20 1503

## 2020-02-29 NOTE — Discharge Instructions (Addendum)
Take tapered steroid as directed. Apply steroid cream twice daily for 5 days.

## 2020-02-29 NOTE — ED Triage Notes (Signed)
Pt c/o rash on her chest x 1 week. Pt states she has  Been using cortisone 10 with no relief. She states the rash is very itchy. She denies SOB.

## 2020-06-06 NOTE — Progress Notes (Signed)
75 y.o. G26P2002 Divorced Black or [place] American Not Hispanic or Latino female here for annual exam.  Patient states that she is still having hot flashes. H/O hysterectomy.   H/O HSV, on suppression. Wants to continue, still gets occasional outbreaks.   No bowel or bladder c/o. She c/o nocturia x 2, normal amounts. 2 cups of coffee in the am. She drinks tea in the evening.   Her Uncle died of Covid 2 weeks ago, he was in his 18's.     Patient's last menstrual period was 03/30/1989 (approximate).          Sexually active: No.  The current method of family planning is status post hysterectomy.    Exercising: No.  The patient does not participate in regular exercise at present. Smoker:  no  Health Maintenance: Pap:  Unsure  History of abnormal Pap:  no MMG:  07/26/19 density B Bi-rads 1 neg  BMD:  07/12/17 Osteopenia, Minimal, t score -1.1. Frax 4/0.4%, f/u dexa in 10 years  Colonoscopy: 2016 normal per patient  TDaP:  03/26/10 Gardasil: NA    reports that she has never smoked. She has never used smokeless tobacco. She reports that she does not drink alcohol and does not use drugs. 2 grown children.One grandson, 23, local  Past Medical History:  Diagnosis Date  . Allergy   . Arthritis   . STD (sexually transmitted disease)    HSV  . Vitamin D deficiency     Past Surgical History:  Procedure Laterality Date  . ABDOMINAL HYSTERECTOMY  1991   Fibroids, AUB  . CHOLECYSTECTOMY  1990's  . COLONOSCOPY WITH PROPOFOL N/A 10/18/2014   Procedure: COLONOSCOPY WITH PROPOFOL;  Surgeon: Willis Modena, MD;  Location: [facility];  Service: Endoscopy;  Laterality: N/A;  ultraslim scope   . TONSILLECTOMY  age 61    Current Outpatient Medications  Medication Sig Dispense Refill  . Multiple Vitamin (MULTIVITAMIN) tablet Take 1 tablet by mouth daily.    Marland Kitchen triamcinolone (KENALOG) 0.1 % Apply 1 application topically 2 (two) times daily. 30 g 0  . valACYclovir (VALTREX) 500 MG tablet 1 tab  po qd, increase to one tab BID with an outbreak 90 tablet 4   No current facility-administered medications for this visit.    Family History  Problem Relation Age of Onset  . Diabetes Mother   . Kidney disease Mother        only has one kidney  . Heart attack Father   . Diabetes Brother     Review of Systems  All other systems reviewed and are negative.   Exam:   BP 134/64   Pulse 99   Ht 5\' 1"  (1.549 m)   Wt 139 lb 6.4 oz (63.2 kg)   LMP 03/30/1989 (Approximate)   SpO2 99%   BMI 26.34 kg/m   Weight change: @WEIGHTCHANGE @ Height:   Height: 5\' 1"  (154.9 cm)  Ht Readings from Last 3 Encounters:  06/10/20 5\' 1"  (1.549 m)  06/07/19 5' 1.5" (1.562 m)  06/02/18 5\' 3"  (1.6 m)    General appearance: alert, cooperative and appears stated age Head: Normocephalic, without obvious abnormality, atraumatic Neck: no adenopathy, supple, symmetrical, trachea midline and thyroid normal to inspection and palpation Breasts: normal appearance, no masses or tenderness Abdomen: soft, non-tender; non distended,  no masses,  no organomegaly Extremities: extremities normal, atraumatic, no cyanosis or edema Skin: Skin color, texture, turgor normal. No rashes or lesions Lymph nodes: Cervical, supraclavicular, and axillary nodes normal. No  abnormal inguinal nodes palpated Neurologic: Grossly normal   Pelvic: External genitalia:  no lesions              Urethra:  normal appearing urethra with no masses, tenderness or lesions              Bartholins and Skenes: normal                 Vagina: normal appearing vagina with normal color and discharge, no lesions              Cervix: absent               Bimanual Exam:  Uterus:  uterus absent              Adnexa: fullness in the left adnexa (suspect stool)               Rectovaginal: Confirms               Anus:  normal sphincter tone, no lesions  Carolynn Serve chaperoned for the exam.  1. Gynecologic exam normal Discussed breast self  exam Discussed calcium and vit D intake Labs with primary Mammogram next month DEXA and colonoscopy are UTD  2. Adnexal fullness Suspect stool, return for a f/u exam in 1 week   3. Herpes simplex infection of genitourinary system Doing well, wants to continue - valACYclovir (VALTREX) 500 MG tablet; 1 tab po qd, increase to one tab BID for 3 days with an outbreak  Dispense: 90 tablet; Refill: 4

## 2020-06-10 ENCOUNTER — Ambulatory Visit: Payer: Medicare Other | Admitting: Obstetrics and Gynecology

## 2020-06-10 ENCOUNTER — Encounter: Payer: Self-pay | Admitting: Obstetrics and Gynecology

## 2020-06-10 ENCOUNTER — Other Ambulatory Visit: Payer: Self-pay

## 2020-06-10 VITALS — BP 134/64 | HR 99 | Ht 61.0 in | Wt 139.4 lb

## 2020-06-10 DIAGNOSIS — Z01411 Encounter for gynecological examination (general) (routine) with abnormal findings: Secondary | ICD-10-CM | POA: Diagnosis not present

## 2020-06-10 DIAGNOSIS — A6000 Herpesviral infection of urogenital system, unspecified: Secondary | ICD-10-CM | POA: Diagnosis not present

## 2020-06-10 DIAGNOSIS — N949 Unspecified condition associated with female genital organs and menstrual cycle: Secondary | ICD-10-CM | POA: Diagnosis not present

## 2020-06-10 MED ORDER — VALACYCLOVIR HCL 500 MG PO TABS: ORAL_TABLET | ORAL | 4 refills | Status: AC

## 2020-06-10 NOTE — Patient Instructions (Signed)

## 2020-06-18 ENCOUNTER — Ambulatory Visit: Payer: Medicare Other | Admitting: Obstetrics and Gynecology

## 2021-01-02 ENCOUNTER — Other Ambulatory Visit: Payer: Self-pay | Admitting: Urgent Care

## 2021-01-02 ENCOUNTER — Other Ambulatory Visit: Payer: Self-pay | Admitting: Obstetrics and Gynecology

## 2021-01-02 DIAGNOSIS — Z1231 Encounter for screening mammogram for malignant neoplasm of breast: Secondary | ICD-10-CM

## 2021-01-09 ENCOUNTER — Ambulatory Visit: Payer: Federal, State, Local not specified - PPO

## 2021-02-05 ENCOUNTER — Ambulatory Visit
Admission: RE | Admit: 2021-02-05 | Discharge: 2021-02-05 | Disposition: A | Discharge status: Home / Self Care | Payer: Medicare Other | Source: Ambulatory Visit | Attending: Urgent Care | Admitting: Urgent Care

## 2021-02-05 ENCOUNTER — Other Ambulatory Visit: Payer: Self-pay

## 2021-02-05 DIAGNOSIS — Z1231 Encounter for screening mammogram for malignant neoplasm of breast: Secondary | ICD-10-CM

## 2023-02-03 ENCOUNTER — Other Ambulatory Visit: Payer: Self-pay

## 2023-02-03 DIAGNOSIS — Z0000 Encounter for general adult medical examination without abnormal findings: Secondary | ICD-10-CM

## 2023-02-24 ENCOUNTER — Ambulatory Visit
Admission: RE | Admit: 2023-02-24 | Discharge: 2023-02-24 | Disposition: A | Discharge status: Home / Self Care | Payer: Medicare Other | Source: Ambulatory Visit

## 2023-02-24 DIAGNOSIS — Z0000 Encounter for general adult medical examination without abnormal findings: Secondary | ICD-10-CM

## 2024-01-14 ENCOUNTER — Other Ambulatory Visit: Payer: Self-pay

## 2024-01-14 ENCOUNTER — Other Ambulatory Visit: Payer: Self-pay | Admitting: Nurse Practitioner

## 2024-01-14 DIAGNOSIS — Z1231 Encounter for screening mammogram for malignant neoplasm of breast: Secondary | ICD-10-CM

## 2024-01-24 ENCOUNTER — Other Ambulatory Visit: Payer: Self-pay | Admitting: Nurse Practitioner

## 2024-01-24 ENCOUNTER — Ambulatory Visit
Admission: RE | Admit: 2024-01-24 | Discharge: 2024-01-24 | Disposition: A | Discharge status: Home / Self Care | Source: Ambulatory Visit | Attending: Nurse Practitioner | Admitting: Nurse Practitioner

## 2024-01-24 DIAGNOSIS — M549 Dorsalgia, unspecified: Secondary | ICD-10-CM

## 2024-01-26 ENCOUNTER — Other Ambulatory Visit: Payer: Self-pay

## 2024-01-26 ENCOUNTER — Inpatient Hospital Stay (HOSPITAL_COMMUNITY)
Admission: EM | Admit: 2024-01-26 | Discharge: 2024-01-28 | DRG: 392 | Disposition: A | Discharge status: Home / Self Care | Attending: Student | Admitting: Student

## 2024-01-26 ENCOUNTER — Encounter (HOSPITAL_COMMUNITY): Payer: Self-pay | Admitting: *Deleted

## 2024-01-26 ENCOUNTER — Emergency Department (HOSPITAL_COMMUNITY)

## 2024-01-26 DIAGNOSIS — Z90710 Acquired absence of both cervix and uterus: Secondary | ICD-10-CM

## 2024-01-26 DIAGNOSIS — Z833 Family history of diabetes mellitus: Secondary | ICD-10-CM

## 2024-01-26 DIAGNOSIS — K5720 Diverticulitis of large intestine with perforation and abscess without bleeding: Secondary | ICD-10-CM | POA: Diagnosis not present

## 2024-01-26 DIAGNOSIS — R030 Elevated blood-pressure reading, without diagnosis of hypertension: Secondary | ICD-10-CM | POA: Diagnosis present

## 2024-01-26 DIAGNOSIS — K5732 Diverticulitis of large intestine without perforation or abscess without bleeding: Secondary | ICD-10-CM | POA: Diagnosis present

## 2024-01-26 DIAGNOSIS — Z8249 Family history of ischemic heart disease and other diseases of the circulatory system: Secondary | ICD-10-CM

## 2024-01-26 DIAGNOSIS — D649 Anemia, unspecified: Secondary | ICD-10-CM | POA: Diagnosis present

## 2024-01-26 DIAGNOSIS — Z8419 Family history of other disorders of kidney and ureter: Secondary | ICD-10-CM

## 2024-01-26 DIAGNOSIS — Z882 Allergy status to sulfonamides status: Secondary | ICD-10-CM

## 2024-01-26 LAB — COMPREHENSIVE METABOLIC PANEL WITH GFR
ALT: 9 U/L (ref 0–44)
AST: 17 U/L (ref 15–41)
Albumin: 3.1 g/dL — ABNORMAL LOW (ref 3.5–5.0)
Alkaline Phosphatase: 76 U/L (ref 38–126)
Anion gap: 8 (ref 5–15)
BUN: 14 mg/dL (ref 8–23)
CO2: 25 mmol/L (ref 22–32)
Calcium: 8.6 mg/dL — ABNORMAL LOW (ref 8.9–10.3)
Chloride: 106 mmol/L (ref 98–111)
Creatinine, Ser: 0.97 mg/dL (ref 0.44–1.00)
GFR, Estimated: >60 mL/min (ref >60)
Glucose, Bld: 110 mg/dL — ABNORMAL HIGH (ref 70–99)
Potassium: 4.1 mmol/L (ref 3.5–5.1)
Sodium: 139 mmol/L (ref 135–145)
Total Bilirubin: 0.8 mg/dL (ref 0.0–1.2)
Total Protein: 6.8 g/dL (ref 6.5–8.1)

## 2024-01-26 LAB — CBC WITH DIFFERENTIAL/PLATELET
Abs Immature Granulocytes: 0.02 K/uL (ref 0.00–0.07)
Basophils Absolute: 0 K/uL (ref 0.0–0.1)
Basophils Relative: 0 %
Eosinophils Absolute: 0.1 K/uL (ref 0.0–0.5)
Eosinophils Relative: 1 %
HCT: 36 % (ref 36.0–46.0)
Hemoglobin: 11.4 g/dL — ABNORMAL LOW (ref 12.0–15.0)
Immature Granulocytes: 0 %
Lymphocytes Relative: 30 %
Lymphs Abs: 2.2 K/uL (ref 0.7–4.0)
MCH: 28.3 pg (ref 26.0–34.0)
MCHC: 31.7 g/dL (ref 30.0–36.0)
MCV: 89.3 fL (ref 80.0–100.0)
Monocytes Absolute: 0.7 K/uL (ref 0.1–1.0)
Monocytes Relative: 10 %
Neutro Abs: 4.5 K/uL (ref 1.7–7.7)
Neutrophils Relative %: 59 %
Platelets: 329 K/uL (ref 150–400)
RBC: 4.03 MIL/uL (ref 3.87–5.11)
RDW: 12.1 % (ref 11.5–15.5)
WBC: 7.5 K/uL (ref 4.0–10.5)
nRBC: 0 % (ref 0.0–0.2)

## 2024-01-26 LAB — LIPASE, BLOOD: Lipase: 20 U/L (ref 11–51)

## 2024-01-26 LAB — I-STAT CG4 LACTIC ACID, ED: Lactic Acid, Venous: 0.8 mmol/L (ref 0.5–1.9)

## 2024-01-26 MED ORDER — IOHEXOL 350 MG/ML SOLN
60.0000 mL | Freq: Once | INTRAVENOUS | Status: AC | PRN
  Administered 2024-01-26: 60 mL via INTRAVENOUS

## 2024-01-26 MED ORDER — LACTATED RINGERS IV BOLUS
1000.0000 mL | Freq: Once | INTRAVENOUS | Status: AC
  Administered 2024-01-27: 1000 mL via INTRAVENOUS

## 2024-01-26 MED ORDER — ONDANSETRON HCL 4 MG/2ML IJ SOLN
4.0000 mg | Freq: Once | INTRAMUSCULAR | Status: AC
  Administered 2024-01-27: 4 mg via INTRAVENOUS
  Filled 2024-01-26: qty 2

## 2024-01-26 MED ORDER — PIPERACILLIN-TAZOBACTAM 3.375 G IVPB 30 MIN
3.3750 g | Freq: Once | INTRAVENOUS | Status: AC
  Administered 2024-01-27: 3.375 g via INTRAVENOUS
  Filled 2024-01-26: qty 50

## 2024-01-26 MED ORDER — FENTANYL CITRATE (PF) 50 MCG/ML IJ SOSY
50.0000 ug | PREFILLED_SYRINGE | Freq: Once | INTRAMUSCULAR | Status: AC
  Administered 2024-01-27: 50 ug via INTRAVENOUS
  Filled 2024-01-26: qty 1

## 2024-01-26 NOTE — ED Provider Triage Note (Signed)
 Emergency Medicine Provider Triage Evaluation Note  GWENDALYN MCGONAGLE , a 78 y.o. female  was evaluated in triage.  Pt complains of abdominal pain.  Review of Systems  Positive: AP, lower; bloody rectal discharge Negative: Vomiting, fever  Physical Exam  BP (!) 173/77 (BP Location: Right Arm)   Pulse (!) 114   Temp 98.5 F (36.9 C)   Resp 19   Ht 5' 1 (1.549 m)   Wt 63.2 kg   LMP 03/30/1989 (Approximate)   SpO2 99%   BMI 26.33 kg/m  Gen:   Awake, no distress   Resp:  Normal effort  MSK:   Moves extremities without difficulty  Other:    Medical Decision Making  Medically screening exam initiated at 5:55 PM.  Appropriate orders placed.  Edilia W Kawa was informed that the remainder of the evaluation will be completed by another provider, this initial triage assessment does not replace that evaluation, and the importance of remaining in the ED until their evaluation is complete.  Lower abdominal pain and bloating on and off for the past several days. No fever. +Nausea without vomiting. She is not passing any formed stools. H/O cholecystectomy, hysterectomy.    Odell Balls, PA-C 01/26/24 1757

## 2024-01-26 NOTE — ED Provider Notes (Signed)
 [place] EMERGENCY DEPARTMENT AT [facility] Provider Note   CSN: [id number] Arrival date & time: 01/26/24  1636     Patient presents with: Abdominal Pain   Brandy Brown is a 78 y.o. female.  {Add pertinent medical, surgical, social history, OB history to YEP:67052} Patient with no significant medical history here with lower abdominal pain and cramping for the past 2 weeks.  Reports periumbilical abdominal pain that comes and goes lasting for several minutes to hours at a time.  Nausea but no vomiting and poor appetite.  Reports not passing any formed stools for the past 3 days but did pass some mucus 2 days ago.  No blood.  No fever.  No pain with urination or blood in the urine.  States no history of similar.  Previous cholecystectomy and hysterectomy.  Reports normal colonoscopy in the past.  No chest pain or shortness of breath.  No dizziness or lightheadedness.  The history is provided by the patient.  Abdominal Pain Associated symptoms: constipation, nausea and vomiting   Associated symptoms: no chest pain, no cough, no diarrhea, no dysuria, no fever, no hematuria and no shortness of breath        Prior to Admission medications   Medication Sig Start Date End Date Taking? Authorizing Provider  Multiple Vitamin (MULTIVITAMIN) tablet Take 1 tablet by mouth daily.    [provider]  triamcinolone  (KENALOG ) 0.1 % Apply 1 application topically 2 (two) times daily. 02/29/20   Lambing, Jaclyn M, PA-C  valACYclovir  (VALTREX ) 500 MG tablet 1 tab po qd, increase to one tab BID for 3 days with an outbreak 06/10/20   Jertson, Jill Evelyn, MD    Allergies: Sulfa drugs cross reactors    Review of Systems  Constitutional:  Positive for activity change and appetite change. Negative for fever.  HENT:  Negative for congestion and rhinorrhea.   Respiratory:  Negative for cough, chest tightness and shortness of breath.   Cardiovascular:  Negative for chest pain.   Gastrointestinal:  Positive for abdominal pain, constipation, nausea and vomiting. Negative for diarrhea.  Genitourinary:  Negative for dysuria and hematuria.  Musculoskeletal:  Negative for arthralgias and myalgias.  Skin:  Negative for rash.  Neurological:  Negative for dizziness, weakness and headaches.   all other systems are negative except as noted in the HPI and PMH.    Updated Vital Signs BP (!) 166/77 (BP Location: Left Arm)   Pulse 79   Temp 98.5 F (36.9 C)   Resp 16   Ht 5' 1 (1.549 m)   Wt 63.2 kg   LMP 03/30/1989 (Approximate)   SpO2 99%   BMI 26.33 kg/m   Physical Exam Vitals and nursing note reviewed.  Constitutional:      General: She is not in acute distress.    Appearance: She is well-developed.  HENT:     Head: Normocephalic and atraumatic.     Mouth/Throat:     Pharynx: No oropharyngeal exudate.  Eyes:     Conjunctiva/sclera: Conjunctivae normal.     Pupils: Pupils are equal, round, and reactive to light.  Neck:     Comments: No meningismus. Cardiovascular:     Rate and Rhythm: Normal rate and regular rhythm.     Heart sounds: Normal heart sounds. No murmur heard. Pulmonary:     Effort: Pulmonary effort is normal. No respiratory distress.     Breath sounds: Normal breath sounds.  Abdominal:     Palpations: Abdomen is soft.  Tenderness: There is no abdominal tenderness. There is no guarding or rebound.     Comments: Periumbilical abdominal pain, no guarding or rebound  Musculoskeletal:        General: No tenderness. Normal range of motion.     Cervical back: Normal range of motion and neck supple.  Skin:    General: Skin is warm.  Neurological:     Mental Status: She is alert and oriented to person, place, and time.     Cranial Nerves: No cranial nerve deficit.     Motor: No abnormal muscle tone.     Coordination: Coordination normal.     Comments:  5/5 strength throughout. CN 2-12 intact.Equal grip strength.   Psychiatric:         Behavior: Behavior normal.     (all labs ordered are listed, but only abnormal results are displayed) Labs Reviewed  CBC WITH DIFFERENTIAL/PLATELET - Abnormal; Notable for the following components:      Result Value   Hemoglobin 11.4 (*)    All other components within normal limits  COMPREHENSIVE METABOLIC PANEL WITH GFR - Abnormal; Notable for the following components:   Glucose, Bld 110 (*)    Calcium 8.6 (*)    Albumin 3.1 (*)    All other components within normal limits  CULTURE, BLOOD (ROUTINE X 2)  CULTURE, BLOOD (ROUTINE X 2)  LIPASE, BLOOD  URINALYSIS, ROUTINE W REFLEX MICROSCOPIC  I-STAT CG4 LACTIC ACID, ED  I-STAT CG4 LACTIC ACID, ED    EKG: None  Radiology: CT ABDOMEN PELVIS W CONTRAST Result Date: 01/26/2024 EXAM: CT ABDOMEN AND PELVIS WITH CONTRAST 01/26/2024 07:26:00 PM TECHNIQUE: CT of the abdomen and pelvis was performed with the administration of 60 mL of iohexol (OMNIPAQUE) 350 MG/ML injection. Multiplanar reformatted images are provided for review. Automated exposure control, iterative reconstruction, and/or weight-based adjustment of the mA/kV was utilized to reduce the radiation dose to as low as reasonably achievable. COMPARISON: CT abdomen and pelvis 10/09/2014. CLINICAL HISTORY: Bowel obstruction suspected. Pt has HX of hysterectomy and gallbladder removal surgery. PT c/o intermittent abdominal pain, nausea, and vomiting for about 2 weeks. PT arrives AxOx4. Pt also reports constipation for the past few days. Omni 350 60mL. Pt has HX of hysterectomy and gallbladder removal surgery. 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 LIVER: The liver is unremarkable. GALLBLADDER AND BILE DUCTS: Status post cholecystectomy. No biliary ductal dilatation. SPLEEN: No acute abnormality. PANCREAS: No acute abnormality. ADRENAL GLANDS: No acute abnormality. KIDNEYS, URETERS AND BLADDER: No stones in the kidneys or ureters. No  hydronephrosis. No perinephric or periureteral stranding. Urinary bladder is unremarkable. GI AND BOWEL: Single thickened fold along the greater curvature of the stomach of unclear etiology (6:69; 7:80; 3:25) . Colonic diverticulosis. Bowel wall thickening of the mid sigmoid colon with associated interval development of an intramural abscess measuring up to 1.5 x 0.9 cm (6:40). Associated trace pericolonic fat stranding. Stool throughout the majority of the colon. No small bowel thickening or dilatation. No large bowel dilatation. The appendix is not definitively identified with no inflammatory changes in the right lower quadrant to suggest acute appendicitis. PERITONEUM AND RETROPERITONEUM: No ascites. No free air. VASCULATURE: Aorta is normal in caliber. Mild atherosclerotic plaque. LYMPH NODES: No lymphadenopathy. REPRODUCTIVE ORGANS: Status post hysterectomy. No adnexal mass. BONES AND SOFT TISSUES: Mild degenerative changes of the left hip. No acute osseous abnormality. No focal soft tissue abnormality. IMPRESSION: 1. Colonic diverticulosis with complicated mid sigmoid colon acute diverticulitis -  associated 1.5 cm intramural abscess. Recommend colonoscopy status post treatment and status post complete resolution of inflammatory changes to exclude an underlying lesion. 2. Single thickened fold along the greater curvature of the stomach of unclear etiology; if concern for gastritis, ulcer, gastric malignancy, consider outpatient endoscopic evaluation. Electronically signed by: Morgane Naveau MD 01/26/2024 07:42 PM EDT RP Workstation: [id number]    {Document cardiac monitor, telemetry assessment procedure when appropriate:32947} Procedures   Medications Ordered in the ED  lactated ringers  bolus 1,000 mL (has no administration in time range)  fentaNYL (SUBLIMAZE) injection 50 mcg (has no administration in time range)  ondansetron (ZOFRAN) injection 4 mg (has no administration in time range)  iohexol  (OMNIPAQUE) 350 MG/ML injection 60 mL (60 mLs Intravenous Contrast Given 01/26/24 1929)      {Click here for ABCD2, HEART and other calculators REFRESH Note before signing:1}                              Medical Decision Making Amount and/or Complexity of Data Reviewed Labs: ordered. Decision-making details documented in ED Course. Radiology: ordered and independent interpretation performed. Decision-making details documented in ED Course. ECG/medicine tests: ordered and independent interpretation performed. Decision-making details documented in ED Course.  Risk Prescription drug management.   Lower abdominal pain x 2 weeks that comes and goes.  Vomiting constipation.  Stable vitals.  Tachycardia has resolved. Abdomen is tender but no peritoneal signs.  Will give IV fluids, pain and nausea medication.  Lactate is normal.  No leukocytosis  CT scan is concerning for diverticulitis with intramural abscess. Will initiate IV antibiotics.   {Document critical care time when appropriate  Document review of labs and clinical decision tools ie CHADS2VASC2, etc  Document your independent review of radiology images and any outside records  Document your discussion with family members, caretakers and with consultants  Document social determinants of health affecting pt's care  Document your decision making why or why not admission, treatments were needed:32947:::1}   Final diagnoses:  None    ED Discharge Orders     None

## 2024-01-26 NOTE — ED Triage Notes (Signed)
 Pt arrives via POV. PT c/o intermittent abdominal pain, nausea, and vomiting for about 2 weeks. PT arrives AxOx4. Pt also reports constipation for the past few days.

## 2024-01-26 NOTE — ED Triage Notes (Signed)
 No bm for several days some n and v

## 2024-01-26 NOTE — ED Notes (Signed)
 Pt said her IV was bothering her and asked for it to be removed. Informed that if she needed any medications or things of the sort when in a room, she would have to get stuck again she said that is fine. IV removed.

## 2024-01-27 ENCOUNTER — Encounter (HOSPITAL_COMMUNITY): Payer: Self-pay | Admitting: Internal Medicine

## 2024-01-27 DIAGNOSIS — K5720 Diverticulitis of large intestine with perforation and abscess without bleeding: Secondary | ICD-10-CM | POA: Diagnosis present

## 2024-01-27 DIAGNOSIS — K5732 Diverticulitis of large intestine without perforation or abscess without bleeding: Secondary | ICD-10-CM | POA: Diagnosis not present

## 2024-01-27 DIAGNOSIS — Z833 Family history of diabetes mellitus: Secondary | ICD-10-CM | POA: Diagnosis not present

## 2024-01-27 DIAGNOSIS — R030 Elevated blood-pressure reading, without diagnosis of hypertension: Secondary | ICD-10-CM | POA: Diagnosis present

## 2024-01-27 DIAGNOSIS — Z8249 Family history of ischemic heart disease and other diseases of the circulatory system: Secondary | ICD-10-CM | POA: Diagnosis not present

## 2024-01-27 DIAGNOSIS — Z882 Allergy status to sulfonamides status: Secondary | ICD-10-CM | POA: Diagnosis not present

## 2024-01-27 DIAGNOSIS — Z90710 Acquired absence of both cervix and uterus: Secondary | ICD-10-CM | POA: Diagnosis not present

## 2024-01-27 DIAGNOSIS — D649 Anemia, unspecified: Secondary | ICD-10-CM | POA: Diagnosis present

## 2024-01-27 DIAGNOSIS — Z8419 Family history of other disorders of kidney and ureter: Secondary | ICD-10-CM | POA: Diagnosis not present

## 2024-01-27 LAB — URINALYSIS, ROUTINE W REFLEX MICROSCOPIC
Bilirubin Urine: NEGATIVE
Glucose, UA: NEGATIVE mg/dL
Hgb urine dipstick: NEGATIVE
Ketones, ur: 5 mg/dL — AB
Leukocytes,Ua: NEGATIVE
Nitrite: NEGATIVE
Protein, ur: NEGATIVE mg/dL
Specific Gravity, Urine: >1.046 — ABNORMAL HIGH (ref 1.005–1.030)
pH: 5 (ref 5.0–8.0)

## 2024-01-27 LAB — CBC WITH DIFFERENTIAL/PLATELET
Abs Immature Granulocytes: 0.03 K/uL (ref 0.00–0.07)
Basophils Absolute: 0 K/uL (ref 0.0–0.1)
Basophils Relative: 0 %
Eosinophils Absolute: 0.1 K/uL (ref 0.0–0.5)
Eosinophils Relative: 1 %
HCT: 34.6 % — ABNORMAL LOW (ref 36.0–46.0)
Hemoglobin: 11.3 g/dL — ABNORMAL LOW (ref 12.0–15.0)
Immature Granulocytes: 0 %
Lymphocytes Relative: 23 %
Lymphs Abs: 1.9 K/uL (ref 0.7–4.0)
MCH: 28.7 pg (ref 26.0–34.0)
MCHC: 32.7 g/dL (ref 30.0–36.0)
MCV: 87.8 fL (ref 80.0–100.0)
Monocytes Absolute: 0.7 K/uL (ref 0.1–1.0)
Monocytes Relative: 9 %
Neutro Abs: 5.4 K/uL (ref 1.7–7.7)
Neutrophils Relative %: 67 %
Platelets: 323 K/uL (ref 150–400)
RBC: 3.94 MIL/uL (ref 3.87–5.11)
RDW: 12.1 % (ref 11.5–15.5)
WBC: 8.2 K/uL (ref 4.0–10.5)
nRBC: 0 % (ref 0.0–0.2)

## 2024-01-27 LAB — BASIC METABOLIC PANEL WITH GFR
Anion gap: 11 (ref 5–15)
BUN: 10 mg/dL (ref 8–23)
CO2: 25 mmol/L (ref 22–32)
Calcium: 8.8 mg/dL — ABNORMAL LOW (ref 8.9–10.3)
Chloride: 101 mmol/L (ref 98–111)
Creatinine, Ser: 0.83 mg/dL (ref 0.44–1.00)
GFR, Estimated: >60 mL/min (ref >60)
Glucose, Bld: 96 mg/dL (ref 70–99)
Potassium: 3.9 mmol/L (ref 3.5–5.1)
Sodium: 137 mmol/L (ref 135–145)

## 2024-01-27 LAB — HEPATIC FUNCTION PANEL
ALT: 14 U/L (ref 0–44)
AST: 23 U/L (ref 15–41)
Albumin: 2.9 g/dL — ABNORMAL LOW (ref 3.5–5.0)
Alkaline Phosphatase: 75 U/L (ref 38–126)
Bilirubin, Direct: 0.2 mg/dL (ref 0.0–0.2)
Indirect Bilirubin: 0.9 mg/dL (ref 0.3–0.9)
Total Bilirubin: 1.1 mg/dL (ref 0.0–1.2)
Total Protein: 6.6 g/dL (ref 6.5–8.1)

## 2024-01-27 MED ORDER — DEXTROSE IN LACTATED RINGERS 5 % IV SOLN: INTRAVENOUS | Status: DC

## 2024-01-27 MED ORDER — PIPERACILLIN-TAZOBACTAM 3.375 G IVPB
3.3750 g | Freq: Three times a day (TID) | INTRAVENOUS | Status: AC
  Administered 2024-01-27 – 2024-01-28 (×4): 3.375 g via INTRAVENOUS
  Filled 2024-01-27 (×4): qty 50

## 2024-01-27 NOTE — Plan of Care (Signed)

## 2024-01-27 NOTE — Progress Notes (Signed)
 ED Pharmacy Antibiotic Sign Off An antibiotic consult was received from an ED provider for Zosyn  per pharmacy dosing for intra-abdominal infection. A chart review was completed to assess appropriateness.   The following one time order(s) were placed:  Zosyn  3.375g IV x 1  Further antibiotic and/or antibiotic pharmacy consults should be ordered by the admitting provider if indicated.   Lynwood Mckusick, PharmD, BCPS Clinical Pharmacist Phone: [phone]

## 2024-01-27 NOTE — H&P (Signed)
 History and Physical    REJOICE HEATWOLE FMW:[id number] DOB: 08/11/1945 DOA: 01/26/2024  Patient coming from: Home.  Chief Complaint: Abdominal pain.  HPI: Brandy Brown is a 78 y.o. female with no significant past medical history presents to the ER with complaints of abdominal pain.  Patient states she has been having abdominal pain off and on for last few weeks but over the last 1 week it has acutely worsened and pain is mostly across the lower abdominal quadrants.  Had some nausea denies any vomiting or diarrhea.  She did have bowel movement 2 days ago which was mostly mucus.  Patient states she had a colonoscopy last year with Dr. Burnette [facility].  ED Course: In the ER patient is afebrile.  CT abdomen pelvis done shows acute sigmoid diverticulitis with intramural abscess measuring around 1.5 cm.  Cultures obtained and started on empiric antibiotics.  Labs show hemoglobin of 11.4 WBC 7.5 lactic acid 0.8 creatinine 0.97.  Review of Systems: As per HPI, rest all negative.   Past Medical History:  Diagnosis Date   Allergy    Arthritis    STD (sexually transmitted disease)    HSV   Vitamin D  deficiency     Past Surgical History:  Procedure Laterality Date   ABDOMINAL HYSTERECTOMY  1991   Fibroids, AUB   CHOLECYSTECTOMY  1990's   COLONOSCOPY WITH PROPOFOL  N/A 10/18/2014   Procedure: COLONOSCOPY WITH PROPOFOL ;  Surgeon: Elsie Burnette, MD;  Location: [facility];  Service: Endoscopy;  Laterality: N/A;  ultraslim scope    TONSILLECTOMY  age 51     reports that she has never smoked. She has never used smokeless tobacco. She reports that she does not drink alcohol and does not use drugs.  Allergies  Allergen Reactions   Sulfa Drugs Cross Reactors Hives, Swelling and Other (See Comments)    Face and lips swell, hives, and itching.     Family History  Problem Relation Age of Onset   Diabetes Mother    Kidney disease Mother        only has one kidney   Heart attack Father     Diabetes Brother     Prior to Admission medications   Medication Sig Start Date End Date Taking? Authorizing Provider  carboxymethylcellulose (REFRESH PLUS) 0.5 % SOLN Place 2 drops into both eyes at bedtime.   Yes [provider]  Multiple Vitamins-Minerals (CENTRUM SILVER 50+WOMEN) TABS Take 1 tablet by mouth in the morning.   Yes [provider]  triamcinolone  (KENALOG ) 0.1 % Apply 1 application topically 2 (two) times daily. Patient taking differently: Apply 1 application  topically 2 (two) times daily as needed (itching, skin irritations). 02/29/20  Yes Andreoni, Jaclyn M, PA-C  valACYclovir  (VALTREX ) 500 MG tablet 1 tab po qd, increase to one tab BID for 3 days with an outbreak Patient taking differently: Take 500 mg by mouth daily. Take one tablet (500mg ) by mouth once per day; increase to one tab BID for 3 days with an outbreak 06/10/20  Yes Jertson, Jill Evelyn, MD    Physical Exam: Constitutional: Moderately built and nourished. Vitals:   01/26/24 1658 01/26/24 1951 01/26/24 2259 01/27/24 0239  BP:  (!) 141/72 (!) 166/77 (!) 154/79  Pulse:  87 79 79  Resp:  16 16 18   Temp:  99.1 F (37.3 C) 98.5 F (36.9 C) 97.8 F (36.6 C)  TempSrc:  Oral  Axillary  SpO2:  99% 99% 100%  Weight: 63.2 kg  Height: 5' 1 (1.549 m)      Eyes: Anicteric no pallor. ENMT: No discharge from the ears eyes nose and mouth. Neck: No mass felt.  No neck rigidity. Respiratory: No rhonchi or crepitations. Cardiovascular: S1-S2 heard. Abdomen: Soft mild tenderness in the left lower quadrant but no guarding or rigidity. Musculoskeletal: No edema. Skin: No rash. Neurologic: Alert awake oriented to time place and person.  Moves all extremities. Psychiatric: Appears normal.  Normal affect.   Labs on Admission: I have personally reviewed following labs and imaging studies  CBC: Recent Labs  Lab 01/26/24 1806  WBC 7.5  NEUTROABS 4.5  HGB 11.4*  HCT 36.0  MCV 89.3  PLT 329    Basic Metabolic Panel: Recent Labs  Lab 01/26/24 1806  NA 139  K 4.1  CL 106  CO2 25  GLUCOSE 110*  BUN 14  CREATININE 0.97  CALCIUM 8.6*   GFR: Estimated Creatinine Clearance: 41.4 mL/min (by C-G formula based on SCr of 0.97 mg/dL). Liver Function Tests: Recent Labs  Lab 01/26/24 1806  AST 17  ALT 9  ALKPHOS 76  BILITOT 0.8  PROT 6.8  ALBUMIN 3.1*   Recent Labs  Lab 01/26/24 1806  LIPASE 20   No results for input(s): AMMONIA in the last 168 hours. Coagulation Profile: No results for input(s): INR, PROTIME in the last 168 hours. Cardiac Enzymes: No results for input(s): CKTOTAL, CKMB, CKMBINDEX, TROPONINI in the last 168 hours. BNP (last 3 results) No results for input(s): PROBNP in the last 8760 hours. HbA1C: No results for input(s): HGBA1C in the last 72 hours. CBG: No results for input(s): GLUCAP in the last 168 hours. Lipid Profile: No results for input(s): CHOL, HDL, LDLCALC, TRIG, CHOLHDL, LDLDIRECT in the last 72 hours. Thyroid Function Tests: No results for input(s): TSH, T4TOTAL, FREET4, T3FREE, THYROIDAB in the last 72 hours. Anemia Panel: No results for input(s): VITAMINB12, FOLATE, FERRITIN, TIBC, IRON, RETICCTPCT in the last 72 hours. Urine analysis:    Component Value Date/Time   BILIRUBINUR n 09/24/2014 1620   PROTEINUR n 09/24/2014 1620   UROBILINOGEN negative 09/24/2014 1620   NITRITE n 09/24/2014 1620   LEUKOCYTESUR Trace (A) 09/24/2014 1620   Sepsis Labs: @LABRCNTIP (procalcitonin:4,lacticidven:4) )No results found for this or any previous visit (from the past 240 hours).   Radiological Exams on Admission: CT ABDOMEN PELVIS W CONTRAST Result Date: 01/26/2024 EXAM: CT ABDOMEN AND PELVIS WITH CONTRAST 01/26/2024 07:26:00 PM TECHNIQUE: CT of the abdomen and pelvis was performed with the administration of 60 mL of iohexol (OMNIPAQUE) 350 MG/ML injection. Multiplanar reformatted  images are provided for review. Automated exposure control, iterative reconstruction, and/or weight-based adjustment of the mA/kV was utilized to reduce the radiation dose to as low as reasonably achievable. COMPARISON: CT abdomen and pelvis 10/09/2014. CLINICAL HISTORY: Bowel obstruction suspected. Pt has HX of hysterectomy and gallbladder removal surgery. PT c/o intermittent abdominal pain, nausea, and vomiting for about 2 weeks. PT arrives AxOx4. Pt also reports constipation for the past few days. Omni 350 60mL. Pt has HX of hysterectomy and gallbladder removal surgery. 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 LIVER: The liver is unremarkable. GALLBLADDER AND BILE DUCTS: Status post cholecystectomy. No biliary ductal dilatation. SPLEEN: No acute abnormality. PANCREAS: No acute abnormality. ADRENAL GLANDS: No acute abnormality. KIDNEYS, URETERS AND BLADDER: No stones in the kidneys or ureters. No hydronephrosis. No perinephric or periureteral stranding. Urinary bladder is unremarkable. GI AND BOWEL: Single thickened fold along the greater  curvature of the stomach of unclear etiology (6:69; 7:80; 3:25) . Colonic diverticulosis. Bowel wall thickening of the mid sigmoid colon with associated interval development of an intramural abscess measuring up to 1.5 x 0.9 cm (6:40). Associated trace pericolonic fat stranding. Stool throughout the majority of the colon. No small bowel thickening or dilatation. No large bowel dilatation. The appendix is not definitively identified with no inflammatory changes in the right lower quadrant to suggest acute appendicitis. PERITONEUM AND RETROPERITONEUM: No ascites. No free air. VASCULATURE: Aorta is normal in caliber. Mild atherosclerotic plaque. LYMPH NODES: No lymphadenopathy. REPRODUCTIVE ORGANS: Status post hysterectomy. No adnexal mass. BONES AND SOFT TISSUES: Mild degenerative changes of the left hip. No acute osseous  abnormality. No focal soft tissue abnormality. IMPRESSION: 1. Colonic diverticulosis with complicated mid sigmoid colon acute diverticulitis - associated 1.5 cm intramural abscess. Recommend colonoscopy status post treatment and status post complete resolution of inflammatory changes to exclude an underlying lesion. 2. Single thickened fold along the greater curvature of the stomach of unclear etiology; if concern for gastritis, ulcer, gastric malignancy, consider outpatient endoscopic evaluation. Electronically signed by: Morgane Naveau MD 01/26/2024 07:42 PM EDT RP Workstation: [id number]     Assessment/Plan Principal Problem:   Abscess of sigmoid colon due to diverticulitis Active Problems:   Elevated blood pressure reading   Anemia   Diverticulitis of sigmoid colon    Acute sigmoid diverticulitis with intramural abscess -     will keep patient NPO.  Started on empiric antibiotics Zosyn.  IV fluids and pain relief medications.  Consult general surgery. Elevated blood pressure readings will follow blood pressure trends.  Has not had any prior history of diagnosis of hypertension. Anemia follow CBC. Abnormal radiological finding in the greater curvature of the stomach.  Will need follow-up as outpatient.  Since patient has acute sigmoid diverticulitis with abscess will need close monitoring and further workup and more than 2 midnight stay.   DVT prophylaxis: SCDs. Code Status: Full code. Family Communication: Discussed with patient. Disposition Plan: Medical floor. Consults called: Will consult general surgery. Admission status: Inpatient.

## 2024-01-27 NOTE — Progress Notes (Signed)
 PROGRESS NOTE  Brandy Brown FMW:[id number] DOB: [birth year]   PCP: System, Provider Not In  Patient is from: Home.  Independently ambulates at baseline.  DOA: 01/26/2024 LOS: 0  Chief complaints Chief Complaint  Patient presents with   Abdominal Pain     Brief Narrative / Interim history: 78 year old F with PMH of diverticulosis presented to ED with intermittent abdominal pain for last few weeks that has gotten worse over the last 1 week, and found to have acute sigmoid diverticulitis with intramural abscess measuring around 1.5 cm.  She was hemodynamically stable.  WBC 7.5.  Lactic acid 0.8.  Blood cultures obtained.  Started on IV Zosyn.  General surgery consulted for  Subjective: Seen and examined earlier this morning.  No major events overnight or this morning.  Reports improvement in her pain.  Denies nausea or vomiting.  No bowel movements.   Assessment and plan: Acute sigmoid diverticulitis with intramural abscess-pain improving.  No leukocytosis. -Continue IV Zosyn -Started clear liquid diet. -Continue IV fluids -Analgesics as needed  Normocytic anemia: Stable  Body mass index is 26.33 kg/m.          DVT prophylaxis:  SCDs Start: 01/27/24 0310  Code Status: Full code Family Communication: None at bedside Level of care: Med-Surg Status is: Inpatient Remains inpatient appropriate because: Acute diverticulitis requiring IV antibiotics   Final disposition: Home in the next 24 to 48 hours   35 minutes with more than 50% spent in reviewing records, counseling patient/family and coordinating care.  Consultants:  General Surgery  Procedures: None  Microbiology summarized: Blood cultures NGTD  Objective: Vitals:   01/26/24 2259 01/27/24 0239 01/27/24 0757 01/27/24 1219  BP: (!) 166/77 (!) 154/79 134/71 (!) 140/68  Pulse: 79 79 72 82  Resp: 16 18 16 16   Temp: 98.5 F (36.9 C) 97.8 F (36.6 C) 97.9 F (36.6 C) 98.5 F (36.9 C)  TempSrc:   Axillary Oral Oral  SpO2: 99% 100% 100% 98%  Weight:      Height:        Examination:  GENERAL: No apparent distress.  Nontoxic. HEENT: MMM.  Vision and hearing grossly intact.  NECK: Supple.  No apparent JVD.  RESP:  No IWOB.  Fair aeration bilaterally. CVS:  RRR. Heart sounds normal.  ABD/GI/GU: BS+. Abd soft, NTND.  MSK/EXT:  Moves extremities. No apparent deformity. No edema.  SKIN: no apparent skin lesion or wound NEURO: AA.  Oriented appropriately.  No apparent focal neuro deficit. PSYCH: Calm. Normal affect.   Sch Meds:  Scheduled Meds: Continuous Infusions:  dextrose 5% lactated ringers  Stopped (01/27/24 0718)   piperacillin-tazobactam (ZOSYN)  IV Stopped (01/27/24 1105)   PRN Meds:.  Antimicrobials: Anti-infectives (From admission, onward)    Start     Dose/Rate Route Frequency Ordered Stop   01/27/24 0600  piperacillin-tazobactam (ZOSYN) IVPB 3.375 g        3.375 g 12.5 mL/hr over 240 Minutes Intravenous Every 8 hours 01/27/24 0319     01/27/24 0000  piperacillin-tazobactam (ZOSYN) IVPB 3.375 g        3.375 g 100 mL/hr over 30 Minutes Intravenous  Once 01/26/24 2352 01/27/24 0129        I have personally reviewed the following labs and images: CBC: Recent Labs  Lab 01/26/24 1806 01/27/24 0310  WBC 7.5 8.2  NEUTROABS 4.5 5.4  HGB 11.4* 11.3*  HCT 36.0 34.6*  MCV 89.3 87.8  PLT 329 323   BMP &GFR Recent Labs  Lab  01/26/24 1806 01/27/24 0310  NA 139 137  K 4.1 3.9  CL 106 101  CO2 25 25  GLUCOSE 110* 96  BUN 14 10  CREATININE 0.97 0.83  CALCIUM 8.6* 8.8*   Estimated Creatinine Clearance: 48.4 mL/min (by C-G formula based on SCr of 0.83 mg/dL). Liver & Pancreas: Recent Labs  Lab 01/26/24 1806 01/27/24 0310  AST 17 23  ALT 9 14  ALKPHOS 76 75  BILITOT 0.8 1.1  PROT 6.8 6.6  ALBUMIN 3.1* 2.9*   Recent Labs  Lab 01/26/24 1806  LIPASE 20   No results for input(s): AMMONIA in the last 168 hours. Diabetic: No results for  input(s): HGBA1C in the last 72 hours. No results for input(s): GLUCAP in the last 168 hours. Cardiac Enzymes: No results for input(s): CKTOTAL, CKMB, CKMBINDEX, TROPONINI in the last 168 hours. No results for input(s): PROBNP in the last 8760 hours. Coagulation Profile: No results for input(s): INR, PROTIME in the last 168 hours. Thyroid Function Tests: No results for input(s): TSH, T4TOTAL, FREET4, T3FREE, THYROIDAB in the last 72 hours. Lipid Profile: No results for input(s): CHOL, HDL, LDLCALC, TRIG, CHOLHDL, LDLDIRECT in the last 72 hours. Anemia Panel: No results for input(s): VITAMINB12, FOLATE, FERRITIN, TIBC, IRON, RETICCTPCT in the last 72 hours. Urine analysis:    Component Value Date/Time   COLORURINE YELLOW 01/27/2024 0324   APPEARANCEUR CLEAR 01/27/2024 0324   LABSPEC >1.046 (H) 01/27/2024 0324   PHURINE 5.0 01/27/2024 0324   GLUCOSEU NEGATIVE 01/27/2024 0324   HGBUR NEGATIVE 01/27/2024 0324   BILIRUBINUR NEGATIVE 01/27/2024 0324   BILIRUBINUR n 09/24/2014 1620   KETONESUR 5 (A) 01/27/2024 0324   PROTEINUR NEGATIVE 01/27/2024 0324   UROBILINOGEN negative 09/24/2014 1620   NITRITE NEGATIVE 01/27/2024 0324   LEUKOCYTESUR NEGATIVE 01/27/2024 0324   Sepsis Labs: Invalid input(s): PROCALCITONIN, LACTICIDVEN  Microbiology: Recent Results (from the past 240 hours)  Blood culture (routine x 2)     Status: None (Preliminary result)   Collection Time: 01/27/24 12:09 AM   Specimen: BLOOD LEFT ARM  Result Value Ref Range Status   Specimen Description BLOOD LEFT ARM  Final   Special Requests   Final    BOTTLES DRAWN AEROBIC AND ANAEROBIC Blood Culture results may not be optimal due to an inadequate volume of blood received in culture bottles   Culture   Final    NO GROWTH < 12 HOURS Performed at [facility], [address], [place], [place] [address]    Report Status PENDING  Incomplete  Blood culture  (routine x 2)     Status: None (Preliminary result)   Collection Time: 01/27/24 12:10 AM   Specimen: BLOOD RIGHT ARM  Result Value Ref Range Status   Specimen Description BLOOD RIGHT ARM  Final   Special Requests   Final    BOTTLES DRAWN AEROBIC AND ANAEROBIC Blood Culture results may not be optimal due to an inadequate volume of blood received in culture bottles   Culture   Final    NO GROWTH < 12 HOURS Performed at [facility], [address], [place], [place] [address]    Report Status PENDING  Incomplete    Radiology Studies: CT ABDOMEN PELVIS W CONTRAST Result Date: 01/26/2024 EXAM: CT ABDOMEN AND PELVIS WITH CONTRAST 01/26/2024 07:26:00 PM TECHNIQUE: CT of the abdomen and pelvis was performed with the administration of 60 mL of iohexol (OMNIPAQUE) 350 MG/ML injection. Multiplanar reformatted images are provided for review. Automated exposure  control, iterative reconstruction, and/or weight-based adjustment of the mA/kV was utilized to reduce the radiation dose to as low as reasonably achievable. COMPARISON: CT abdomen and pelvis 10/09/2014. CLINICAL HISTORY: Bowel obstruction suspected. Pt has HX of hysterectomy and gallbladder removal surgery. PT c/o intermittent abdominal pain, nausea, and vomiting for about 2 weeks. PT arrives AxOx4. Pt also reports constipation for the past few days. Omni 350 60mL. Pt has HX of hysterectomy and gallbladder removal surgery. 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 LIVER: The liver is unremarkable. GALLBLADDER AND BILE DUCTS: Status post cholecystectomy. No biliary ductal dilatation. SPLEEN: No acute abnormality. PANCREAS: No acute abnormality. ADRENAL GLANDS: No acute abnormality. KIDNEYS, URETERS AND BLADDER: No stones in the kidneys or ureters. No hydronephrosis. No perinephric or periureteral stranding. Urinary bladder is unremarkable. GI AND BOWEL: Single thickened fold along the greater  curvature of the stomach of unclear etiology (6:69; 7:80; 3:25) . Colonic diverticulosis. Bowel wall thickening of the mid sigmoid colon with associated interval development of an intramural abscess measuring up to 1.5 x 0.9 cm (6:40). Associated trace pericolonic fat stranding. Stool throughout the majority of the colon. No small bowel thickening or dilatation. No large bowel dilatation. The appendix is not definitively identified with no inflammatory changes in the right lower quadrant to suggest acute appendicitis. PERITONEUM AND RETROPERITONEUM: No ascites. No free air. VASCULATURE: Aorta is normal in caliber. Mild atherosclerotic plaque. LYMPH NODES: No lymphadenopathy. REPRODUCTIVE ORGANS: Status post hysterectomy. No adnexal mass. BONES AND SOFT TISSUES: Mild degenerative changes of the left hip. No acute osseous abnormality. No focal soft tissue abnormality. IMPRESSION: 1. Colonic diverticulosis with complicated mid sigmoid colon acute diverticulitis - associated 1.5 cm intramural abscess. Recommend colonoscopy status post treatment and status post complete resolution of inflammatory changes to exclude an underlying lesion. 2. Single thickened fold along the greater curvature of the stomach of unclear etiology; if concern for gastritis, ulcer, gastric malignancy, consider outpatient endoscopic evaluation. Electronically signed by: Morgane Naveau MD 01/26/2024 07:42 PM EDT RP Workstation: [id number]      Ujbz T. Tiawanna Luchsinger [facility]  If 7PM-7AM, please contact night-coverage [email or web address] 01/27/2024, 12:26 PM

## 2024-01-27 NOTE — Consult Note (Signed)
 Reason for Consult:  Diverticulitis Referring Provider: Franky, MD  HPI  Brandy Brown is an 78 y.o. female with hx of diverticulosis who has had intermittent abdominal pain for several weeks, presenting to ED due to worsening of pain.  Workup noted acute sigmoid diverticulitis on imaging with 1.5cm intramural abscess. No leukocytosis. Normal lactic acid.   Patient denies fevers. She states that usually pain is minimal but will have intermittent severe pain and this is what brought her in. She has not had a bowel movement in several days and typically has one daily. She states she last had a colonoscopy 1 year ago with no abnormalities but I am only able to review colonoscopy report from 2016 that had poor prep, several subtle sessile lesions and diverticulosis with tight sigmoid colon secondary to diverticula needing pediatric scope.   10 point review of systems is negative except as listed above in HPI.  Objective  Past Medical History: Past Medical History:  Diagnosis Date   Allergy    Arthritis    STD (sexually transmitted disease)    HSV   Vitamin D  deficiency     Past Surgical History: Past Surgical History:  Procedure Laterality Date   ABDOMINAL HYSTERECTOMY  1991   Fibroids, AUB   CHOLECYSTECTOMY  1990's   COLONOSCOPY WITH PROPOFOL  N/A 10/18/2014   Procedure: COLONOSCOPY WITH PROPOFOL ;  Surgeon: Elsie Cree, MD;  Location: [facility];  Service: Endoscopy;  Laterality: N/A;  ultraslim scope    TONSILLECTOMY  age 67    Family History:  Family History  Problem Relation Age of Onset   Diabetes Mother    Kidney disease Mother        only has one kidney   Heart attack Father    Diabetes Brother     Social History:  reports that she has never smoked. She has never used smokeless tobacco. She reports that she does not drink alcohol and does not use drugs.  Allergies:  Allergies  Allergen Reactions   Sulfa Drugs Cross Reactors Hives, Swelling and Other  (See Comments)    Face and lips swell, hives, and itching.     Medications: I have reviewed the patient's current medications.  Labs: I have personally reviewed all labs for the past 24h  Imaging: I have personally reviewed and interpreted all imaging for the past 24h and agree with the radiologist's impression.  CT ABDOMEN PELVIS W CONTRAST Result Date: 01/26/2024 EXAM: CT ABDOMEN AND PELVIS WITH CONTRAST 01/26/2024 07:26:00 PM TECHNIQUE: CT of the abdomen and pelvis was performed with the administration of 60 mL of iohexol (OMNIPAQUE) 350 MG/ML injection. Multiplanar reformatted images are provided for review. Automated exposure control, iterative reconstruction, and/or weight-based adjustment of the mA/kV was utilized to reduce the radiation dose to as low as reasonably achievable. COMPARISON: CT abdomen and pelvis 10/09/2014. CLINICAL HISTORY: Bowel obstruction suspected. Pt has HX of hysterectomy and gallbladder removal surgery. PT c/o intermittent abdominal pain, nausea, and vomiting for about 2 weeks. PT arrives AxOx4. Pt also reports constipation for the past few days. Omni 350 60mL. Pt has HX of hysterectomy and gallbladder removal surgery. 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 LIVER: The liver is unremarkable. GALLBLADDER AND BILE DUCTS: Status post cholecystectomy. No biliary ductal dilatation. SPLEEN: No acute abnormality. PANCREAS: No acute abnormality. ADRENAL GLANDS: No acute abnormality. KIDNEYS, URETERS AND BLADDER: No stones in the kidneys or ureters. No hydronephrosis. No perinephric or periureteral stranding. Urinary bladder  is unremarkable. GI AND BOWEL: Single thickened fold along the greater curvature of the stomach of unclear etiology (6:69; 7:80; 3:25) . Colonic diverticulosis. Bowel wall thickening of the mid sigmoid colon with associated interval development of an intramural abscess measuring up to 1.5 x 0.9 cm  (6:40). Associated trace pericolonic fat stranding. Stool throughout the majority of the colon. No small bowel thickening or dilatation. No large bowel dilatation. The appendix is not definitively identified with no inflammatory changes in the right lower quadrant to suggest acute appendicitis. PERITONEUM AND RETROPERITONEUM: No ascites. No free air. VASCULATURE: Aorta is normal in caliber. Mild atherosclerotic plaque. LYMPH NODES: No lymphadenopathy. REPRODUCTIVE ORGANS: Status post hysterectomy. No adnexal mass. BONES AND SOFT TISSUES: Mild degenerative changes of the left hip. No acute osseous abnormality. No focal soft tissue abnormality. IMPRESSION: 1. Colonic diverticulosis with complicated mid sigmoid colon acute diverticulitis - associated 1.5 cm intramural abscess. Recommend colonoscopy status post treatment and status post complete resolution of inflammatory changes to exclude an underlying lesion. 2. Single thickened fold along the greater curvature of the stomach of unclear etiology; if concern for gastritis, ulcer, gastric malignancy, consider outpatient endoscopic evaluation. Electronically signed by: Morgane Naveau MD 01/26/2024 07:42 PM EDT RP Workstation: [id number]     Physical Exam Blood pressure 134/71, pulse 72, temperature 97.9 F (36.6 C), temperature source Oral, resp. rate 16, height 5' 1 (1.549 m), weight 63.2 kg, last menstrual period 03/30/1989, SpO2 100%. General: no acute distress HEENT: normocephalic, atraumatic Oropharynx: mucous membranes moist CV: Regular rate and rhythm, hypertensive Chest: equal chest rise bilaterally normal respiratory effort on room air Abdomen: soft and nondistended, minimally tender in suprapubic region Extremities: moves all extremities Skin: warm, dry, no rashes Psych: normal memory, normal mood/affect  Neuro: No focal neurologic deficits, A&Ox3    Assessment   Brandy Brown is an 78 y.o. female with acute diverticulitis with 1.5cm  intramural abscess  Plan  - Okay for CLD - IV abx - Would not pursue IR drainage given intramural location and small size  - No indication for surgical intervention at this time. I did discuss with patient if she develops worsening symptoms or hemodynamic instability she may warrant at emergent operation that would likely be resection and end colostomy, but I do not anticipate that being the case and feel most likely can get her through this admission with nonoperative management. - DVT - SCDs, ok for DVT ppx from surgical perspective - Dispo - med-surg   I reviewed ED provider notes, hospitalist notes, last 24 h vitals and pain scores, last 48 h intake and output, last 24 h labs and trends, and last 24 h imaging results.  This care required moderate level of medical decision making.   Orie Silversmith, MD [facility]

## 2024-01-28 ENCOUNTER — Other Ambulatory Visit (HOSPITAL_COMMUNITY): Payer: Self-pay

## 2024-01-28 DIAGNOSIS — K5732 Diverticulitis of large intestine without perforation or abscess without bleeding: Secondary | ICD-10-CM | POA: Diagnosis not present

## 2024-01-28 DIAGNOSIS — R030 Elevated blood-pressure reading, without diagnosis of hypertension: Secondary | ICD-10-CM | POA: Diagnosis not present

## 2024-01-28 DIAGNOSIS — K5720 Diverticulitis of large intestine with perforation and abscess without bleeding: Secondary | ICD-10-CM | POA: Diagnosis not present

## 2024-01-28 LAB — CBC
HCT: 32.1 % — ABNORMAL LOW (ref 36.0–46.0)
Hemoglobin: 10.2 g/dL — ABNORMAL LOW (ref 12.0–15.0)
MCH: 28.3 pg (ref 26.0–34.0)
MCHC: 31.8 g/dL (ref 30.0–36.0)
MCV: 89.2 fL (ref 80.0–100.0)
Platelets: 293 K/uL (ref 150–400)
RBC: 3.6 MIL/uL — ABNORMAL LOW (ref 3.87–5.11)
RDW: 12 % (ref 11.5–15.5)
WBC: 5.9 K/uL (ref 4.0–10.5)
nRBC: 0 % (ref 0.0–0.2)

## 2024-01-28 LAB — RENAL FUNCTION PANEL
Albumin: 2.5 g/dL — ABNORMAL LOW (ref 3.5–5.0)
Anion gap: 12 (ref 5–15)
BUN: 5 mg/dL — ABNORMAL LOW (ref 8–23)
CO2: 26 mmol/L (ref 22–32)
Calcium: 8.6 mg/dL — ABNORMAL LOW (ref 8.9–10.3)
Chloride: 103 mmol/L (ref 98–111)
Creatinine, Ser: 1.05 mg/dL — ABNORMAL HIGH (ref 0.44–1.00)
GFR, Estimated: 55 mL/min — ABNORMAL LOW (ref >60)
Glucose, Bld: 113 mg/dL — ABNORMAL HIGH (ref 70–99)
Phosphorus: 4.1 mg/dL (ref 2.5–4.6)
Potassium: 4.2 mmol/L (ref 3.5–5.1)
Sodium: 141 mmol/L (ref 135–145)

## 2024-01-28 LAB — MAGNESIUM: Magnesium: 1.9 mg/dL (ref 1.7–2.4)

## 2024-01-28 MED ORDER — AMOXICILLIN-POT CLAVULANATE 875-125 MG PO TABS
1.0000 | ORAL_TABLET | Freq: Two times a day (BID) | ORAL | 0 refills | Status: AC
  Filled 2024-01-28: qty 18, 9d supply, fill #0

## 2024-01-28 MED ORDER — AMOXICILLIN-POT CLAVULANATE 875-125 MG PO TABS
1.0000 | ORAL_TABLET | Freq: Two times a day (BID) | ORAL | Status: DC
  Administered 2024-01-28: 1 via ORAL
  Filled 2024-01-28: qty 1

## 2024-01-28 NOTE — Discharge Summary (Signed)
 Physician Discharge Summary  Brandy Brown FMW:[id number] DOB: Apr 22, 1945 DOA: 01/26/2024  PCP: System, Provider Not In  Admit date: 01/26/2024 Discharge date: 01/28/24  Admitted From: Home Disposition: Home Recommendations for Outpatient Follow-up:  Outpatient follow-up with PCP in 1 to 2 weeks Check CMP and CBC at follow-up Outpatient follow-up with GI for colonoscopy in 6 to 8 weeks after treatment of diverticulitis Please follow up on the following pending results: None  Home Health: No need identified Equipment/Devices: No need identified  Discharge Condition: Stable CODE STATUS: Full code Diet Orders (From admission, onward)     Start     Ordered   01/28/24 0819  DIET SOFT Room service appropriate? Yes; Fluid consistency: Thin  Diet effective now       Question Answer Comment  Room service appropriate? Yes   Fluid consistency: Thin      01/28/24 [facility]              Hospital course 78 year old F with PMH of diverticulosis presented to ED with intermittent abdominal pain for last few weeks that has gotten worse over the last 1 week, and found to have acute sigmoid diverticulitis with intramural abscess measuring around 1.5 cm.  She was hemodynamically stable.  WBC 7.5.  Lactic acid 0.8.  Blood cultures obtained.  Started on IV Zosyn.  General surgery consulted.  The next day, patient improved.  Evaluated by general surgery who recommended continuing IV antibiotics and restarting clear liquid diet.  She was advanced to full liquid diet later in the day.  On the day of discharge, patient's symptoms resolved.  Tolerated soft diet and cleared for discharge by general surgery on p.o. Augmentin for 9 days to complete a total of 10 days course.  Patient will continue soft low fiber residue diet until she completes treatments and slowly increase fiber content afterward.  Encouraged to follow-up with gastroenterology for colonoscopy in 6 to 8 weeks.   See individual problem  list below for more.   Problems addressed during this hospitalization Principal Problem:   Abscess of sigmoid colon due to diverticulitis Active Problems:   Elevated blood pressure reading   Anemia   Diverticulitis of sigmoid colon    Body mass index is 26.33 kg/m.           Consultations: General surgery  Time spent 35  minutes  Vital signs Vitals:   01/27/24 1422 01/27/24 2038 01/28/24 0500 01/28/24 0758  BP: (!) 168/74 (!) 129/57 131/65 136/74  Pulse: 79 93 83 86  Temp: 98.1 F (36.7 C) 99.1 F (37.3 C) 97.8 F (36.6 C) 98.6 F (37 C)  Resp: 16 18 16 16   Height:      Weight:      SpO2: 96% 100% 98% 97%  TempSrc: Oral Oral Oral Oral  BMI (Calculated):         Discharge exam  GENERAL: No apparent distress.  Nontoxic. HEENT: MMM.  Vision and hearing grossly intact.  NECK: Supple.  No apparent JVD.  RESP:  No IWOB.  Fair aeration bilaterally. CVS:  RRR. Heart sounds normal.  ABD/GI/GU: BS+. Abd soft, NTND.  MSK/EXT:  Moves extremities. No apparent deformity. No edema.  SKIN: no apparent skin lesion or wound NEURO: Awake and alert. Oriented appropriately.  No apparent focal neuro deficit. PSYCH: Calm. Normal affect.   Discharge Instructions Discharge Instructions     Discharge instructions   Complete by: As directed    It has been a pleasure taking care of  you!  You were hospitalized due to diverticulitis for which you have been treated with antibiotics.  Your symptoms improved.  We are discharging you on more antibiotics to complete treatment course.  It is very important that you complete the whole course of antibiotics regardless of improvement.  Continue soft low fiber residue diet until you finish antibiotics then slowly increase fiber content in your diet.  Follow-up with your primary care doctor in 1 to 2 weeks.  Follow-up with gastroenterology for colonoscopy in 6 to 8 weeks.   Take care,   Increase activity slowly   Complete by: As directed        Allergies as of 01/28/2024       Reactions   Sulfa Drugs Cross Reactors Hives, Swelling, Other (See Comments)   Face and lips swell, hives, and itching.         Medication List     TAKE these medications    amoxicillin-clavulanate 875-125 MG tablet Commonly known as: AUGMENTIN Take 1 tablet by mouth 2 (two) times daily for 9 days.   carboxymethylcellulose 0.5 % Soln Commonly known as: REFRESH PLUS Place 2 drops into both eyes at bedtime.   Centrum Silver 50+Women Tabs Take 1 tablet by mouth in the morning.   triamcinolone  cream 0.1 % Commonly known as: KENALOG  Apply 1 application topically 2 (two) times daily. What changed:  when to take this reasons to take this   valACYclovir  500 MG tablet Commonly known as: VALTREX  1 tab po qd, increase to one tab BID for 3 days with an outbreak What changed:  how much to take how to take this when to take this additional instructions         Procedures/Studies:   CT ABDOMEN PELVIS W CONTRAST Result Date: 01/26/2024 EXAM: CT ABDOMEN AND PELVIS WITH CONTRAST 01/26/2024 07:26:00 PM TECHNIQUE: CT of the abdomen and pelvis was performed with the administration of 60 mL of iohexol (OMNIPAQUE) 350 MG/ML injection. Multiplanar reformatted images are provided for review. Automated exposure control, iterative reconstruction, and/or weight-based adjustment of the mA/kV was utilized to reduce the radiation dose to as low as reasonably achievable. COMPARISON: CT abdomen and pelvis 10/09/2014. CLINICAL HISTORY: Bowel obstruction suspected. Pt has HX of hysterectomy and gallbladder removal surgery. PT c/o intermittent abdominal pain, nausea, and vomiting for about 2 weeks. PT arrives AxOx4. Pt also reports constipation for the past few days. Omni 350 60mL. Pt has HX of hysterectomy and gallbladder removal surgery. 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 LIVER: The liver is  unremarkable. GALLBLADDER AND BILE DUCTS: Status post cholecystectomy. No biliary ductal dilatation. SPLEEN: No acute abnormality. PANCREAS: No acute abnormality. ADRENAL GLANDS: No acute abnormality. KIDNEYS, URETERS AND BLADDER: No stones in the kidneys or ureters. No hydronephrosis. No perinephric or periureteral stranding. Urinary bladder is unremarkable. GI AND BOWEL: Single thickened fold along the greater curvature of the stomach of unclear etiology (6:69; 7:80; 3:25) . Colonic diverticulosis. Bowel wall thickening of the mid sigmoid colon with associated interval development of an intramural abscess measuring up to 1.5 x 0.9 cm (6:40). Associated trace pericolonic fat stranding. Stool throughout the majority of the colon. No small bowel thickening or dilatation. No large bowel dilatation. The appendix is not definitively identified with no inflammatory changes in the right lower quadrant to suggest acute appendicitis. PERITONEUM AND RETROPERITONEUM: No ascites. No free air. VASCULATURE: Aorta is normal in caliber. Mild atherosclerotic plaque. LYMPH NODES: No lymphadenopathy. REPRODUCTIVE ORGANS: Status post  hysterectomy. No adnexal mass. BONES AND SOFT TISSUES: Mild degenerative changes of the left hip. No acute osseous abnormality. No focal soft tissue abnormality. IMPRESSION: 1. Colonic diverticulosis with complicated mid sigmoid colon acute diverticulitis - associated 1.5 cm intramural abscess. Recommend colonoscopy status post treatment and status post complete resolution of inflammatory changes to exclude an underlying lesion. 2. Single thickened fold along the greater curvature of the stomach of unclear etiology; if concern for gastritis, ulcer, gastric malignancy, consider outpatient endoscopic evaluation. Electronically signed by: Morgane Naveau MD 01/26/2024 07:42 PM EDT RP Workstation: [id number]   DG Lumbar Spine 2-3 Views Result Date: 01/25/2024 EXAM: 2 or 3 VIEW(S) XRAY OF THE LUMBAR SPINE  01/24/2024 11:30:46 AM COMPARISON: None available. CLINICAL HISTORY: Back Pain. Pt has been having pain in her lower back for the last 2 weeks at night when she is sleeping on her back. The pain radiates down the back of her legs to mid thigh when she feels it. States the pain is an aching/sharp sensation. No prior surgeries to back. FINDINGS: LUMBAR SPINE: BONES: Alignment is normal. No acute fracture. No aggressive appearing osseous lesion. DISCS AND DEGENERATIVE CHANGES: Mild degenerative endplate osteophytes at multiple levels. Mild disc space narrowing at L4-L5. Facet arthrosis greatest at L4-L5 and L5-S1. SOFT TISSUES: Surgical clips in the mid abdomen right of midline. Pelvic phleboliths. IMPRESSION: 1. No acute abnormality of the lumbar spine. 2. Mild multilevel degenerative endplate osteophytes. 3. Mild disc space narrowing at L4-L5. 4. Facet arthrosis greatest at L4-L5 and L5-S1. Electronically signed by: Donnice Mania MD 01/25/2024 09:49 PM EDT RP Workstation: [id number]       The results of significant diagnostics from this hospitalization (including imaging, microbiology, ancillary and laboratory) are listed below for reference.     Microbiology: Recent Results (from the past 240 hours)  Blood culture (routine x 2)     Status: None (Preliminary result)   Collection Time: 01/27/24 12:09 AM   Specimen: BLOOD LEFT ARM  Result Value Ref Range Status   Specimen Description BLOOD LEFT ARM  Final   Special Requests   Final    BOTTLES DRAWN AEROBIC AND ANAEROBIC Blood Culture results may not be optimal due to an inadequate volume of blood received in culture bottles   Culture   Final    NO GROWTH 1 DAY Performed at [facility], [address] [place], [place] [address]    Report Status PENDING  Incomplete  Blood culture (routine x 2)     Status: None (Preliminary result)   Collection Time: 01/27/24 12:10 AM   Specimen: BLOOD RIGHT ARM  Result Value Ref Range Status   Specimen  Description BLOOD RIGHT ARM  Final   Special Requests   Final    BOTTLES DRAWN AEROBIC AND ANAEROBIC Blood Culture results may not be optimal due to an inadequate volume of blood received in culture bottles   Culture   Final    NO GROWTH 1 DAY Performed at [facility], [address] [place], [place] [address]    Report Status PENDING  Incomplete     Labs:  CBC: Recent Labs  Lab 01/26/24 1806 01/27/24 0310 01/28/24 0349  WBC 7.5 8.2 5.9  NEUTROABS 4.5 5.4  --   HGB 11.4* 11.3* 10.2*  HCT 36.0 34.6* 32.1*  MCV 89.3 87.8 89.2  PLT 329 323 293   BMP &GFR Recent Labs  Lab 01/26/24 1806 01/27/24 0310 01/28/24 0349  NA 139 137 141  K  4.1 3.9 4.2  CL 106 101 103  CO2 25 25 26   GLUCOSE 110* 96 113*  BUN 14 10 5*  CREATININE 0.97 0.83 1.05*  CALCIUM 8.6* 8.8* 8.6*  MG  --   --  1.9  PHOS  --   --  4.1   Estimated Creatinine Clearance: 38.3 mL/min (A) (by C-G formula based on SCr of 1.05 mg/dL (H)). Liver & Pancreas: Recent Labs  Lab 01/26/24 1806 01/27/24 0310 01/28/24 0349  AST 17 23  --   ALT 9 14  --   ALKPHOS 76 75  --   BILITOT 0.8 1.1  --   PROT 6.8 6.6  --   ALBUMIN 3.1* 2.9* 2.5*   Recent Labs  Lab 01/26/24 1806  LIPASE 20   No results for input(s): AMMONIA in the last 168 hours. Diabetic: No results for input(s): HGBA1C in the last 72 hours. No results for input(s): GLUCAP in the last 168 hours. Cardiac Enzymes: No results for input(s): CKTOTAL, CKMB, CKMBINDEX, TROPONINI in the last 168 hours. No results for input(s): PROBNP in the last 8760 hours. Coagulation Profile: No results for input(s): INR, PROTIME in the last 168 hours. Thyroid Function Tests: No results for input(s): TSH, T4TOTAL, FREET4, T3FREE, THYROIDAB in the last 72 hours. Lipid Profile: No results for input(s): CHOL, HDL, LDLCALC, TRIG, CHOLHDL, LDLDIRECT in the last 72 hours. Anemia Panel: No results for input(s):  VITAMINB12, FOLATE, FERRITIN, TIBC, IRON, RETICCTPCT in the last 72 hours. Urine analysis:    Component Value Date/Time   COLORURINE YELLOW 01/27/2024 0324   APPEARANCEUR CLEAR 01/27/2024 0324   LABSPEC >1.046 (H) 01/27/2024 0324   PHURINE 5.0 01/27/2024 0324   GLUCOSEU NEGATIVE 01/27/2024 0324   HGBUR NEGATIVE 01/27/2024 0324   BILIRUBINUR NEGATIVE 01/27/2024 0324   BILIRUBINUR n 09/24/2014 1620   KETONESUR 5 (A) 01/27/2024 0324   PROTEINUR NEGATIVE 01/27/2024 0324   UROBILINOGEN negative 09/24/2014 1620   NITRITE NEGATIVE 01/27/2024 0324   LEUKOCYTESUR NEGATIVE 01/27/2024 0324   Sepsis Labs: Invalid input(s): PROCALCITONIN, LACTICIDVEN   SIGNED:  Viridiana Spaid T Dilia Alemany, MD  [facility] 01/28/2024, 11:15 AM

## 2024-01-28 NOTE — Progress Notes (Addendum)
 Progress Note     Interval: NAEO. Pain resolved. No nausea/emesis. Did not eat much yesterday, only clears even though she was advanced to fulls. Just ordered FLD breakfast, if tolerates, can have soft diet for lunch.   Objective: Vital signs in last 24 hours: Temp:  [97.8 F (36.6 C)-99.1 F (37.3 C)] 98.6 F (37 C) (10/31 0758) Pulse Rate:  [79-93] 86 (10/31 0758) Resp:  [16-18] 16 (10/31 0758) BP: (129-168)/(57-74) 136/74 (10/31 0758) SpO2:  [96 %-100 %] 97 % (10/31 0758) Last BM Date : 01/24/24  Intake/Output from previous day: 10/30 0701 - 10/31 0700 In: 1565 [P.O.:500; I.V.:965; IV Piggyback:100] Out: 850 [Urine:850] Intake/Output this shift: No intake/output data recorded.  PE: General: pleasant female  who is laying in bed in NAD HEENT: head is normocephalic, atraumatic.  Sclera are noninjected.  PERRL.  Ears and nose without any masses or lesions.  Mouth is pink and moist Heart: regular, rate, and rhythm Lungs: Normal work of breathing on room air Abd: soft, nontender, nondistended Skin: warm and dry with no masses, lesions, or rashes Neuro: No focal neurologic deficits Psych: A&Ox3 with an appropriate affect.    Lab Results:  Recent Labs    01/27/24 0310 01/28/24 0349  WBC 8.2 5.9  HGB 11.3* 10.2*  HCT 34.6* 32.1*  PLT 323 293   BMET Recent Labs    01/27/24 0310 01/28/24 0349  NA 137 141  K 3.9 4.2  CL 101 103  CO2 25 26  GLUCOSE 96 113*  BUN 10 5*  CREATININE 0.83 1.05*  CALCIUM 8.8* 8.6*   PT/INR No results for input(s): LABPROT, INR in the last 72 hours. CMP     Component Value Date/Time   NA 141 01/28/2024 0349   NA 141 06/02/2018 1530   K 4.2 01/28/2024 0349   CL 103 01/28/2024 0349   CO2 26 01/28/2024 0349   GLUCOSE 113 (H) 01/28/2024 0349   BUN 5 (L) 01/28/2024 0349   BUN 11 06/02/2018 1530   CREATININE 1.05 (H) 01/28/2024 0349   CREATININE 0.85 04/19/2014 1540   CALCIUM 8.6 (L) 01/28/2024 0349   PROT 6.6 01/27/2024  0310   PROT 6.6 06/02/2018 1530   ALBUMIN 2.5 (L) 01/28/2024 0349   ALBUMIN 4.0 06/02/2018 1530   AST 23 01/27/2024 0310   ALT 14 01/27/2024 0310   ALKPHOS 75 01/27/2024 0310   BILITOT 1.1 01/27/2024 0310   BILITOT 0.4 06/02/2018 1530   GFRNONAA 55 (L) 01/28/2024 0349   GFRAA 98 06/02/2018 1530   Lipase     Component Value Date/Time   LIPASE 20 01/26/2024 1806       Studies/Results: CT ABDOMEN PELVIS W CONTRAST Result Date: 01/26/2024 EXAM: CT ABDOMEN AND PELVIS WITH CONTRAST 01/26/2024 07:26:00 PM TECHNIQUE: CT of the abdomen and pelvis was performed with the administration of 60 mL of iohexol (OMNIPAQUE) 350 MG/ML injection. Multiplanar reformatted images are provided for review. Automated exposure control, iterative reconstruction, and/or weight-based adjustment of the mA/kV was utilized to reduce the radiation dose to as low as reasonably achievable. COMPARISON: CT abdomen and pelvis 10/09/2014. CLINICAL HISTORY: Bowel obstruction suspected. Pt has HX of hysterectomy and gallbladder removal surgery. PT c/o intermittent abdominal pain, nausea, and vomiting for about 2 weeks. PT arrives AxOx4. Pt also reports constipation for the past few days. Omni 350 60mL. Pt has HX of hysterectomy and gallbladder removal surgery. 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 LIVER: The liver is unremarkable.  GALLBLADDER AND BILE DUCTS: Status post cholecystectomy. No biliary ductal dilatation. SPLEEN: No acute abnormality. PANCREAS: No acute abnormality. ADRENAL GLANDS: No acute abnormality. KIDNEYS, URETERS AND BLADDER: No stones in the kidneys or ureters. No hydronephrosis. No perinephric or periureteral stranding. Urinary bladder is unremarkable. GI AND BOWEL: Single thickened fold along the greater curvature of the stomach of unclear etiology (6:69; 7:80; 3:25) . Colonic diverticulosis. Bowel wall thickening of the mid sigmoid colon with  associated interval development of an intramural abscess measuring up to 1.5 x 0.9 cm (6:40). Associated trace pericolonic fat stranding. Stool throughout the majority of the colon. No small bowel thickening or dilatation. No large bowel dilatation. The appendix is not definitively identified with no inflammatory changes in the right lower quadrant to suggest acute appendicitis. PERITONEUM AND RETROPERITONEUM: No ascites. No free air. VASCULATURE: Aorta is normal in caliber. Mild atherosclerotic plaque. LYMPH NODES: No lymphadenopathy. REPRODUCTIVE ORGANS: Status post hysterectomy. No adnexal mass. BONES AND SOFT TISSUES: Mild degenerative changes of the left hip. No acute osseous abnormality. No focal soft tissue abnormality. IMPRESSION: 1. Colonic diverticulosis with complicated mid sigmoid colon acute diverticulitis - associated 1.5 cm intramural abscess. Recommend colonoscopy status post treatment and status post complete resolution of inflammatory changes to exclude an underlying lesion. 2. Single thickened fold along the greater curvature of the stomach of unclear etiology; if concern for gastritis, ulcer, gastric malignancy, consider outpatient endoscopic evaluation. Electronically signed by: Morgane Naveau MD 01/26/2024 07:42 PM EDT RP Workstation: [id number]     Assessment/Plan 78 y.o. female with acute diverticulitis with 1.5cm intramural abscess  LOS: 1 day   - Okay to ADAT to soft/low fiber, just ordered FLD for breakfast. - Transition to Augmentin, would send home with Augmentin for total of 10 day course of antibiotics - Discussed with patient she should fu with her PCP outpatient and have referral sent for GI for outpatient colonoscopy in 6-8 weeks - Okay for discharge to home this afternoon if does well with breakfast and lunch.  I reviewed nursing notes, hospitalist notes, last 24 h vitals and pain scores, last 48 h intake and output, last 24 h labs and trends, and last 24 h imaging  results.  This care required straight-forward level of medical decision making.    Richerd Silversmith, MD [facility] 01/28/2024, 8:37 AM Please see Amion for pager number during day hours 7:00am-4:30pm

## 2024-02-01 LAB — CULTURE, BLOOD (ROUTINE X 2)
Culture: NO GROWTH
Culture: NO GROWTH

## 2024-02-28 ENCOUNTER — Ambulatory Visit
Admission: RE | Admit: 2024-02-28 | Discharge: 2024-02-28 | Disposition: A | Source: Ambulatory Visit | Attending: Nurse Practitioner

## 2024-02-28 DIAGNOSIS — Z1231 Encounter for screening mammogram for malignant neoplasm of breast: Secondary | ICD-10-CM
